# Patient Record
Sex: Female | Born: 1965 | Race: White | Hispanic: No | Marital: Married | Smoking: Never smoker
Health system: Southern US, Community
[De-identification: ages and names within clinical notes are randomized; demographics above are authoritative.]

## PROBLEM LIST (undated history)

## (undated) DIAGNOSIS — M51369 Other intervertebral disc degeneration, lumbar region without mention of lumbar back pain or lower extremity pain: Secondary | ICD-10-CM

## (undated) DIAGNOSIS — I10 Essential (primary) hypertension: Secondary | ICD-10-CM

## (undated) DIAGNOSIS — E785 Hyperlipidemia, unspecified: Secondary | ICD-10-CM

## (undated) DIAGNOSIS — G4730 Sleep apnea, unspecified: Secondary | ICD-10-CM

## (undated) DIAGNOSIS — N200 Calculus of kidney: Secondary | ICD-10-CM

## (undated) DIAGNOSIS — F3160 Bipolar disorder, current episode mixed, unspecified: Secondary | ICD-10-CM

## (undated) DIAGNOSIS — M75122 Complete rotator cuff tear or rupture of left shoulder, not specified as traumatic: Secondary | ICD-10-CM

## (undated) DIAGNOSIS — K219 Gastro-esophageal reflux disease without esophagitis: Secondary | ICD-10-CM

## (undated) DIAGNOSIS — M1990 Unspecified osteoarthritis, unspecified site: Secondary | ICD-10-CM

## (undated) DIAGNOSIS — F32A Depression, unspecified: Secondary | ICD-10-CM

## (undated) DIAGNOSIS — G5601 Carpal tunnel syndrome, right upper limb: Secondary | ICD-10-CM

## (undated) DIAGNOSIS — J40 Bronchitis, not specified as acute or chronic: Secondary | ICD-10-CM

## (undated) DIAGNOSIS — Z87442 Personal history of urinary calculi: Secondary | ICD-10-CM

## (undated) DIAGNOSIS — F419 Anxiety disorder, unspecified: Secondary | ICD-10-CM

## (undated) DIAGNOSIS — T7840XA Allergy, unspecified, initial encounter: Secondary | ICD-10-CM

## (undated) DIAGNOSIS — F329 Major depressive disorder, single episode, unspecified: Secondary | ICD-10-CM

## (undated) DIAGNOSIS — C801 Malignant (primary) neoplasm, unspecified: Secondary | ICD-10-CM

## (undated) DIAGNOSIS — G43909 Migraine, unspecified, not intractable, without status migrainosus: Secondary | ICD-10-CM

## (undated) DIAGNOSIS — F4310 Post-traumatic stress disorder, unspecified: Secondary | ICD-10-CM

## (undated) DIAGNOSIS — K582 Mixed irritable bowel syndrome: Secondary | ICD-10-CM

## (undated) DIAGNOSIS — F909 Attention-deficit hyperactivity disorder, unspecified type: Secondary | ICD-10-CM

## (undated) DIAGNOSIS — Z905 Acquired absence of kidney: Secondary | ICD-10-CM

## (undated) HISTORY — PX: LITHOTRIPSY: SUR834

## (undated) HISTORY — DX: Allergy, unspecified, initial encounter: T78.40XA

## (undated) HISTORY — PX: DIAGNOSTIC LAPAROSCOPY: SUR761

## (undated) HISTORY — DX: Bronchitis, not specified as acute or chronic: J40

## (undated) HISTORY — DX: Bipolar disorder, current episode mixed, unspecified: F31.60

## (undated) HISTORY — PX: OTHER SURGICAL HISTORY: SHX169

## (undated) HISTORY — PX: NEUROPLASTY / TRANSPOSITION ULNAR NERVE AT ELBOW: SUR895

---
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
## 1996-10-05 HISTORY — PX: TUBAL LIGATION: SHX77

## 1997-10-05 DIAGNOSIS — C801 Malignant (primary) neoplasm, unspecified: Secondary | ICD-10-CM

## 1997-10-05 HISTORY — PX: OTHER SURGICAL HISTORY: SHX169

## 1997-10-05 HISTORY — DX: Malignant (primary) neoplasm, unspecified: C80.1

## 1998-10-05 HISTORY — PX: ABDOMINAL HYSTERECTOMY: SHX81

## 1998-10-05 HISTORY — PX: CHOLECYSTECTOMY: SHX55

## 1999-10-06 DIAGNOSIS — F3160 Bipolar disorder, current episode mixed, unspecified: Secondary | ICD-10-CM

## 1999-10-06 HISTORY — DX: Bipolar disorder, current episode mixed, unspecified: F31.60

## 2006-08-13 ENCOUNTER — Ambulatory Visit: Payer: Self-pay | Admitting: Urology

## 2013-09-11 ENCOUNTER — Ambulatory Visit: Payer: Self-pay | Admitting: Urology

## 2013-09-13 ENCOUNTER — Ambulatory Visit: Payer: Self-pay | Admitting: Urology

## 2013-09-15 ENCOUNTER — Ambulatory Visit: Payer: Self-pay | Admitting: Urology

## 2015-01-25 NOTE — Op Note (Signed)
PATIENT NAME:  Shawna Santiago, Shawna Santiago MR#:  [id number] DATE OF BIRTH:  1965/10/31  DATE OF PROCEDURE:  09/15/2013   PREOPERATIVE DIAGNOSIS: Distal left ureteral calculus.   POSTOPERATIVE DIAGNOSIS:  Distal left ureteral calculus.  PROCEDURE: Cystoscopy, left ureteroscopy (rigid), holmium laser lithotripsy, calculus extraction and stent.  ANESTHESIA: General.   PROCEDURE DETAILS: With the patient sterilely prepped and draped in supine lithotomy position for ease of approach to the external genitalia, the procedure was begun. The patient has good relaxation from a general intubated anesthetic. String is out from the stent, so I slide the stent into the urethral meatus and place an 0.035 Glidewire up the stent into the renal pelvis and under fluoroscopy removed the stent. Then there is quite a bit of clot associated with this, so I maneuver the clot out of the ureter with the basket and the ureteroscope. The ureteroscope is a 7-French telescoping ureteroscope. I localized the calculus, which is distal, and with a 365 micron holmium laser fiber with an end firing tip, I destroy the calculus. Fragments are removed with a 4 wire, zero-tipped Nitinol basket. This results in removal of fragments and clot. I am able to see all the way up the ureter with no fragments left of calculi. Because of the clotting and the probable irritation of the ureter from this calculus, I am going to put a stent in. I put a 24 cm 6-French stent into the bladder and up the ureter over the wire. It is in good position in the renal pelvis and I make sure it is pulled into the bladder. I am able to manipulate it with the scope that I drained the bladder with. I put 30 mL of Marcaine into the bladder. The stent moves easily with the sheath, so I know it is in good position outside the ureteral orifice. Coiled in both ends. I put a B and O suppository in the rectum and sent the patient in satisfactory condition to the recovery  room.   ____________________________ Janice Coffin. Elnoria Howard, [place] rdh:ce D: 09/15/2013 13:58:41 ET T: 09/15/2013 14:32:29 ET JOB#: [id number]  cc: Janice Coffin. Elnoria Howard, DO, <Dictator> Seon Gaertner D Oakley Kossman DO ELECTRONICALLY SIGNED 09/22/2013 15:44

## 2015-01-25 NOTE — Op Note (Signed)
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   PROCEDURE: Cysto, left retrograde pyelogram, stent placement.   The patient was sterilely prepped and draped in supine lithotomy position for ease of approach to the external genitalia, the procedure was begun. She has a surgically absent right kidney. Left distal ureter on cystoscopy appeared to be quite edematous with no calculus in the bladder. There were no tumors, masses or growths in the bladder. Attempted passage of a wire past the calculus was not successful until I utilized an open-ended ureteral catheter to give the wire have a space to slide by the calculus. It slid by the calculus easily. Retrograde revealed minimal hydronephrosis, but some present. The calculus was not visualized with plain film. Ureteroscopy probably will be necessary in the future. I am able to then place a 26 cm 6-French double curling, double-J ureteral stent over the 0.038 Glidewire. It moves easily into the kidney, curls in the renal pelvis. On checking the bladder, there is good curl in the bladder. So the bladder was emptied, string from the stent is cut with scissors. Then, 30 mL of 0.5% Marcaine plain are placed in the bladder and then a B and O suppository in the rectum, and she was sent to recovery with empty bladder in satisfactory condition good with good placement by fluoroscopy of the ureteral stent on the left.     ____________________________ Janice Coffin. Elnoria Howard, DO rdh:dmm D: 09/11/2013 13:26:06 ET T: 09/11/2013 13:36:03 ET JOB#: [id number]  cc: Delfino Lovett D. Elnoria Howard, DO, <Dictator> RICHARD D HART DO ELECTRONICALLY SIGNED 09/22/2013 15:39

## 2015-04-24 DIAGNOSIS — G8929 Other chronic pain: Secondary | ICD-10-CM | POA: Insufficient documentation

## 2015-04-24 DIAGNOSIS — R519 Headache, unspecified: Secondary | ICD-10-CM | POA: Insufficient documentation

## 2015-04-24 DIAGNOSIS — R51 Headache: Secondary | ICD-10-CM

## 2015-06-05 ENCOUNTER — Encounter: Payer: Self-pay | Admitting: *Deleted

## 2015-06-05 DIAGNOSIS — Z79899 Other long term (current) drug therapy: Secondary | ICD-10-CM | POA: Insufficient documentation

## 2015-06-05 DIAGNOSIS — R1084 Generalized abdominal pain: Secondary | ICD-10-CM | POA: Insufficient documentation

## 2015-06-05 DIAGNOSIS — R112 Nausea with vomiting, unspecified: Secondary | ICD-10-CM | POA: Diagnosis present

## 2015-06-05 DIAGNOSIS — G43009 Migraine without aura, not intractable, without status migrainosus: Secondary | ICD-10-CM | POA: Diagnosis not present

## 2015-06-05 DIAGNOSIS — N8320 Unspecified ovarian cysts: Secondary | ICD-10-CM | POA: Insufficient documentation

## 2015-06-05 NOTE — ED Notes (Signed)
Pt states she had onset of migraine tonight, hx of the same (gets infusions at [facility] for migraines- 3 days last week, has had botox treatments and nerve blocks), no additional meds for pain today. Describes pain on the top of her head, has had nausea, flashes of light, and some dizziness.

## 2015-06-06 ENCOUNTER — Emergency Department
Admission: EM | Admit: 2015-06-06 | Discharge: 2015-06-06 | Disposition: A | Discharge status: Home / Self Care | Payer: BLUE CROSS/BLUE SHIELD | Attending: Emergency Medicine | Admitting: Emergency Medicine

## 2015-06-06 ENCOUNTER — Emergency Department: Payer: BLUE CROSS/BLUE SHIELD

## 2015-06-06 DIAGNOSIS — G43009 Migraine without aura, not intractable, without status migrainosus: Secondary | ICD-10-CM

## 2015-06-06 DIAGNOSIS — R1084 Generalized abdominal pain: Secondary | ICD-10-CM

## 2015-06-06 DIAGNOSIS — N83201 Unspecified ovarian cyst, right side: Secondary | ICD-10-CM

## 2015-06-06 HISTORY — DX: Malignant (primary) neoplasm, unspecified: C80.1

## 2015-06-06 HISTORY — DX: Migraine, unspecified, not intractable, without status migrainosus: G43.909

## 2015-06-06 HISTORY — DX: Calculus of kidney: N20.0

## 2015-06-06 LAB — BASIC METABOLIC PANEL
Anion gap: 9 (ref 5–15)
BUN: 12 mg/dL (ref 6–20)
CO2: 25 mmol/L (ref 22–32)
CREATININE: 0.68 mg/dL (ref 0.44–1.00)
Calcium: 8.9 mg/dL (ref 8.9–10.3)
Chloride: 105 mmol/L (ref 101–111)
Glucose, Bld: 98 mg/dL (ref 65–99)
Potassium: 3.7 mmol/L (ref 3.5–5.1)
SODIUM: 139 mmol/L (ref 135–145)

## 2015-06-06 LAB — CBC
HCT: 42.8 % (ref 35.0–47.0)
Hemoglobin: 14 g/dL (ref 12.0–16.0)
MCH: 30.7 pg (ref 26.0–34.0)
MCHC: 32.7 g/dL (ref 32.0–36.0)
MCV: 93.9 fL (ref 80.0–100.0)
PLATELETS: 234 10*3/uL (ref 150–440)
RBC: 4.55 MIL/uL (ref 3.80–5.20)
RDW: 13 % (ref 11.5–14.5)
WBC: 11.3 10*3/uL — AB (ref 3.6–11.0)

## 2015-06-06 LAB — TROPONIN I

## 2015-06-06 MED ORDER — ONDANSETRON 4 MG PO TBDP: 4.0000 mg | ORAL_TABLET | Freq: Three times a day (TID) | ORAL | Status: DC | PRN

## 2015-06-06 MED ORDER — MORPHINE SULFATE (PF) 2 MG/ML IV SOLN
2.0000 mg | Freq: Once | INTRAVENOUS | Status: AC
  Administered 2015-06-06: 2 mg via INTRAVENOUS
  Filled 2015-06-06: qty 1

## 2015-06-06 MED ORDER — KETOROLAC TROMETHAMINE 30 MG/ML IJ SOLN
30.0000 mg | Freq: Once | INTRAMUSCULAR | Status: AC
  Administered 2015-06-06: 30 mg via INTRAVENOUS

## 2015-06-06 MED ORDER — DICYCLOMINE HCL 10 MG PO CAPS: 10.0000 mg | ORAL_CAPSULE | Freq: Three times a day (TID) | ORAL | Status: AC

## 2015-06-06 MED ORDER — DICYCLOMINE HCL 10 MG PO CAPS
10.0000 mg | ORAL_CAPSULE | Freq: Once | ORAL | Status: AC
Start: 2015-06-06 — End: 2015-06-06
  Administered 2015-06-06: 10 mg via ORAL
  Filled 2015-06-06: qty 1

## 2015-06-06 MED ORDER — HYDROMORPHONE HCL 1 MG/ML IJ SOLN
1.0000 mg | Freq: Once | INTRAMUSCULAR | Status: AC
  Administered 2015-06-06: 1 mg via INTRAVENOUS
  Filled 2015-06-06: qty 1

## 2015-06-06 MED ORDER — KETOROLAC TROMETHAMINE 30 MG/ML IJ SOLN
INTRAMUSCULAR | Status: AC
  Administered 2015-06-06: 30 mg via INTRAVENOUS
  Filled 2015-06-06: qty 1

## 2015-06-06 MED ORDER — ONDANSETRON HCL 4 MG/2ML IJ SOLN
4.0000 mg | Freq: Once | INTRAMUSCULAR | Status: AC
  Administered 2015-06-06: 4 mg via INTRAVENOUS

## 2015-06-06 MED ORDER — MORPHINE SULFATE (PF) 4 MG/ML IV SOLN
4.0000 mg | Freq: Once | INTRAVENOUS | Status: AC
  Administered 2015-06-06: 4 mg via INTRAVENOUS
  Filled 2015-06-06: qty 1

## 2015-06-06 MED ORDER — IOHEXOL 240 MG/ML SOLN
25.0000 mL | Freq: Once | INTRAMUSCULAR | Status: AC | PRN
  Administered 2015-06-06: 25 mL via ORAL

## 2015-06-06 MED ORDER — SODIUM CHLORIDE 0.9 % IV BOLUS (SEPSIS)
1000.0000 mL | Freq: Once | INTRAVENOUS | Status: AC
  Administered 2015-06-06: 1000 mL via INTRAVENOUS

## 2015-06-06 MED ORDER — ONDANSETRON HCL 4 MG/2ML IJ SOLN
INTRAMUSCULAR | Status: AC
  Administered 2015-06-06: 4 mg via INTRAVENOUS
  Filled 2015-06-06: qty 2

## 2015-06-06 MED ORDER — IOHEXOL 300 MG/ML  SOLN
100.0000 mL | Freq: Once | INTRAMUSCULAR | Status: AC | PRN
  Administered 2015-06-06: 100 mL via INTRAVENOUS

## 2015-06-06 MED ORDER — ONDANSETRON HCL 4 MG/2ML IJ SOLN
4.0000 mg | Freq: Once | INTRAMUSCULAR | Status: AC
  Administered 2015-06-06: 4 mg via INTRAVENOUS
  Filled 2015-06-06: qty 2

## 2015-06-06 NOTE — ED Notes (Signed)
Provided pt with swab sticks per pt request.

## 2015-06-06 NOTE — ED Notes (Signed)
MD Owens Shark made aware of pt crying and with constant pain at this time. New orders acknowledged and placed by MD. Will complete accordingly.

## 2015-06-06 NOTE — ED Notes (Signed)
Patient with no complaints at this time. Respirations even and unlabored. Skin warm/dry. Discharge instructions reviewed with patient at this time. Patient given opportunity to voice concerns/ask questions. IV removed per policy and band-aid applied to site. Patient discharged at this time and left Emergency Department with steady gait, accompanied by spouse/significant other.

## 2015-06-06 NOTE — ED Provider Notes (Signed)
[facility] Emergency Department Provider Note  ____________________________________________  Time seen:   I have reviewed the triage vital signs and the nursing notes.   HISTORY  Chief Complaint Migraine      HPI Shawna Santiago is a 49 y.o. female presents with "migraine headache 3 days" patient states that she's been treated with Botox treatments and nerve blocks for her migraines in the past. In addition patient admits to generalized abdominal pain nausea and vomiting accompanied by nonbloody diarrhea 3 weeks. Patient states that she has not seen a physician in regards to those symptoms.      Past Medical History  Diagnosis Date  . Cancer     kidney cancer  . Migraine   . Kidney stone     There are no active problems to display for this patient.   Past Surgical History  Procedure Laterality Date  . Abdominal hysterectomy    . Cholecystectomy    . Lithotripsy      Current Outpatient Rx  Name  Route  Sig  Dispense  Refill  . ALPRAZolam (XANAX) 0.25 MG tablet   Oral   Take 0.25 mg by mouth 2 (two) times daily as needed for anxiety. Pt takes 1 tablet every morning and 2 tablets every evening.         Marland Kitchen amphetamine-dextroamphetamine (ADDERALL) 30 MG tablet   Oral   Take 30 mg by mouth every evening.         Marland Kitchen buPROPion (WELLBUTRIN XL) 300 MG 24 hr tablet   Oral   Take 300 mg by mouth daily.         . cetirizine (ZYRTEC) 10 MG tablet   Oral   Take 10 mg by mouth daily.         Marland Kitchen lamoTRIgine (LAMICTAL) 100 MG tablet   Oral   Take 200 mg by mouth daily.         Marland Kitchen lisdexamfetamine (VYVANSE) 50 MG capsule   Oral   Take 50 mg by mouth daily.         . Melatonin 5 MG TABS   Oral   Take 1 tablet by mouth daily as needed.         . Multiple Vitamins-Minerals (MULTIVITAMIN GUMMIES ADULTS PO)   Oral   Take 2 tablets by mouth daily.         . rosuvastatin (CRESTOR) 20 MG tablet   Oral   Take 20 mg by mouth  daily.         Marland Kitchen tiZANidine (ZANAFLEX) 2 MG tablet   Oral   Take 2 mg by mouth every 6 (six) hours as needed for muscle spasms.         . Vilazodone HCl (VIIBRYD) 20 MG TABS   Oral   Take 1 tablet by mouth daily.         . Vortioxetine HBr (BRINTELLIX) 20 MG TABS   Oral   Take 20 mg by mouth daily.           Allergies Stadol  No family history on file.  Social History Social History  Substance Use Topics  . Smoking status: Never Smoker   . Smokeless tobacco: None  . Alcohol Use: No    Review of Systems  Constitutional: Negative for fever. Eyes: Negative for visual changes. ENT: Negative for sore throat. Cardiovascular: Negative for chest pain. Respiratory: Negative for shortness of breath. Gastrointestinal: Negative for abdominal pain, vomiting and diarrhea. Genitourinary: Negative for  dysuria. Musculoskeletal: Negative for back pain. Skin: Negative for rash. Neurological: Negative for headaches, focal weakness or numbness.   10-point ROS otherwise negative.  ____________________________________________   PHYSICAL EXAM:  VITAL SIGNS: ED Triage Vitals  Enc Vitals Group     BP 06/05/15 2246 126/91 mmHg     Pulse Rate 06/05/15 2246 91     Resp 06/05/15 2246 18     Temp 06/05/15 2246 97.8 F (36.6 C)     Temp Source 06/05/15 2246 Oral     SpO2 06/05/15 2246 99 %     Weight 06/05/15 2246 147 lb (66.679 kg)     Height 06/05/15 2246 5\' 1"  (1.549 m)     Head Cir --      Peak Flow --      Pain Score 06/05/15 2246 10     Pain Loc --      Pain Edu? --      Excl. in [place]? --      Constitutional: Alert and oriented. Apparent discomfort Eyes: Conjunctivae are normal. PERRL. Normal extraocular movements. ENT   Head: Normocephalic and atraumatic.   Nose: No congestion/rhinnorhea.   Mouth/Throat: Mucous membranes are moist.   Neck: No stridor. Hematological/Lymphatic/Immunilogical: No cervical lymphadenopathy. Cardiovascular: Normal  rate, regular rhythm. Normal and symmetric distal pulses are present in all extremities. No murmurs, rubs, or gallops. Respiratory: Normal respiratory effort without tachypnea nor retractions. Breath sounds are clear and equal bilaterally. No wheezes/rales/rhonchi. Gastrointestinal: Soft and nontender. No distention. There is no CVA tenderness. Genitourinary: deferred Musculoskeletal: Nontender with normal range of motion in all extremities. No joint effusions.  No lower extremity tenderness nor edema. Neurologic:  Normal speech and language. No gross focal neurologic deficits are appreciated. Speech is normal.  Skin:  Skin is warm, dry and intact. No rash noted. Psychiatric: Mood and affect are normal. Speech and behavior are normal. Patient exhibits appropriate insight and judgment.  ____________________________________________    LABS (pertinent positives/negatives)  Labs Reviewed  CBC - Abnormal; Notable for the following:    WBC 11.3 (*)    All other components within normal limits  BASIC METABOLIC PANEL  TROPONIN I       RADIOLOGY       CT Head Wo Contrast (Final result) Result time: 06/06/15 95:32:02   Final result by Rad Results In Interface (06/06/15 33:43:56)   Narrative:   CLINICAL DATA: Initial evaluation for acute migraine.  EXAM: CT HEAD WITHOUT CONTRAST  TECHNIQUE: Contiguous axial images were obtained from the base of the skull through the vertex without intravenous contrast.  COMPARISON: None.  FINDINGS: There is no acute intracranial hemorrhage or infarct. No mass lesion or midline shift. Gray-white matter differentiation is well maintained. Ventricles are normal in size without evidence of hydrocephalus. CSF containing spaces are within normal limits. No extra-axial fluid collection.  The calvarium is intact.  Orbital soft tissues are within normal limits.  The paranasal sinuses and mastoid air cells are well pneumatized and free of  fluid.  Scalp soft tissues are unremarkable.  IMPRESSION: Normal head CT with no acute intracranial process identified.   Electronically Signed By: Jeannine Boga M.D. On: 06/06/2015 05:08          CT Abdomen Pelvis W Contrast (Final result) Result time: 06/06/15 05:25:23   Final result by Rad Results In Interface (06/06/15 05:25:23)   Narrative:   CLINICAL DATA: Initial evaluation for acute generalized abdominal pain with vomiting and diarrhea.  EXAM: CT ABDOMEN AND PELVIS WITH CONTRAST  TECHNIQUE: Multidetector CT imaging of the abdomen and pelvis was performed using the standard protocol following bolus administration of intravenous contrast.  CONTRAST: 164mL OMNIPAQUE IOHEXOL 300 MG/ML SOLN  COMPARISON: Prior CT from 09/11/2013.  FINDINGS: Visualized lung bases are clear.  Liver demonstrates a normal contrast enhanced appearance. Gallbladder is absent. Mild intra and extra hepatic biliary dilatation like related to post cholecystectomy changes. The spleen, adrenal glands, and pancreas demonstrate a normal contrast enhanced appearance.  Right kidney is surgically absent. No soft tissue mass present within the right renal fossa.  Left kidney within normal limits without evidence of nephrolithiasis or hydronephrosis. No focal enhancing renal mass. Subcentimeter hypodensity within the interpolar left kidney too small the characterize by CT, but statistically may reflect a small cyst.  Stomach within normal limits. No evidence for bowel obstruction. No abnormal wall thickening, mucosal enhancement, or inflammatory fat stranding seen about the bowels.  Bladder within normal limits.  Uterus is absent. Left ovary not discretely identified. There is a in ovoid well-circumscribed cystic lesion measuring 2.6 x 4.0 x 3.3 cm within the right ovary. Additional smaller 1.3 cm cystic lesion slightly anteriorly within the right ovary.  No free air.  Trace free fluid within the right pelvic cul-de-sac.  No pathologically enlarged intra-abdominal or pelvic lymph nodes identified. Normal intravascular enhancement seen throughout the intra-abdominal aorta and its branch vessels.  No acute osseous abnormality. No worrisome lytic or blastic osseous lesion.  IMPRESSION: 1. No CT evidence for acute intra-abdominal pelvic process. 2. Status post right nephrectomy, cholecystectomy, and hysterectomy. 3. 4 cm right adnexal cyst with smaller adjacent cyst. Findings are grossly similar relative to previous MRI from 2014. Again, follow-up examination with dedicated pelvic ultrasound is recommended for complete characterization to ensure that these represent simple cysts is recommended if not already performed. 4. Trace free fluid within the right adnexa.   Electronically Signed By: Jeannine Boga M.D. On: 06/06/2015 05:25          INITIAL IMPRESSION / ASSESSMENT AND PLAN / ED COURSE  Pertinent labs & imaging results that were available during my care of the patient were reviewed by me and considered in my medical decision making (see chart for details).  She received IV morphine 2 with resolution of headache with persistence of abdominal discomfort. CT scan of the head revealed no gross abnormalities likewise CT scan of the abdomen. Patient informed of the right adnexal questionable cysts asked if she was informed of it back in 2014 and if she was referred to gynecology to which she responded no.  ____________________________________________   FINAL CLINICAL IMPRESSION(S) / ED DIAGNOSES  Final diagnoses:  Migraine without aura and without status migrainosus, not intractable  Generalized abdominal pain  Cyst of right ovary      Gregor Hams, MD 06/06/15 [phone]

## 2015-06-06 NOTE — Discharge Instructions (Signed)
Migraine Headache A migraine headache is an intense, throbbing pain on one or both sides of your head. A migraine can last for 30 minutes to several hours. CAUSES  The exact cause of a migraine headache is not always known. However, a migraine may be caused when nerves in the brain become irritated and release chemicals that cause inflammation. This causes pain. Certain things may also trigger migraines, such as:  Alcohol.  Smoking.  Stress.  Menstruation.  Aged cheeses.  Foods or drinks that contain nitrates, glutamate, aspartame, or tyramine.  Lack of sleep.  Chocolate.  Caffeine.  Hunger.  Physical exertion.  Fatigue.  Medicines used to treat chest pain (nitroglycerine), birth control pills, estrogen, and some blood pressure medicines. SIGNS AND SYMPTOMS  Pain on one or both sides of your head.  Pulsating or throbbing pain.  Severe pain that prevents daily activities.  Pain that is aggravated by any physical activity.  Nausea, vomiting, or both.  Dizziness.  Pain with exposure to bright lights, loud noises, or activity.  General sensitivity to bright lights, loud noises, or smells. Before you get a migraine, you may get warning signs that a migraine is coming (aura). An aura may include:  Seeing flashing lights.  Seeing bright spots, halos, or zigzag lines.  Having tunnel vision or blurred vision.  Having feelings of numbness or tingling.  Having trouble talking.  Having muscle weakness. DIAGNOSIS  A migraine headache is often diagnosed based on:  Symptoms.  Physical exam.  A CT scan or MRI of your head. These imaging tests cannot diagnose migraines, but they can help rule out other causes of headaches. TREATMENT Medicines may be given for pain and nausea. Medicines can also be given to help prevent recurrent migraines.  HOME CARE INSTRUCTIONS  Only take over-the-counter or prescription medicines for pain or discomfort as directed by your  health care provider. The use of long-term narcotics is not recommended.  Lie down in a dark, quiet room when you have a migraine.  Keep a journal to find out what may trigger your migraine headaches. For example, write down:  What you eat and drink.  How much sleep you get.  Any change to your diet or medicines.  Limit alcohol consumption.  Quit smoking if you smoke.  Get 7-9 hours of sleep, or as recommended by your health care provider.  Limit stress.  Keep lights dim if bright lights bother you and make your migraines worse. SEEK IMMEDIATE MEDICAL CARE IF:   Your migraine becomes severe.  You have a fever.  You have a stiff neck.  You have vision loss.  You have muscular weakness or loss of muscle control.  You start losing your balance or have trouble walking.  You feel faint or pass out.  You have severe symptoms that are different from your first symptoms. MAKE SURE YOU:   Understand these instructions.  Will watch your condition.  Will get help right away if you are not doing well or get worse. Document Released: 09/21/2005 Document Revised: 02/05/2014 Document Reviewed: 05/29/2013 [facility] Patient Information 2015 [place], [place]. This information is not intended to replace advice given to you by your health care provider. Make sure you discuss any questions you have with your health care provider.  Abdominal Pain Many things can cause abdominal pain. Usually, abdominal pain is not caused by a disease and will improve without treatment. It can often be observed and treated at home. Your health care provider will do a physical  exam and possibly order blood tests and X-rays to help determine the seriousness of your pain. However, in many cases, more time must pass before a clear cause of the pain can be found. Before that point, your health care provider may not know if you need more testing or further treatment. HOME CARE INSTRUCTIONS  Monitor your abdominal  pain for any changes. The following actions may help to alleviate any discomfort you are experiencing:  Only take over-the-counter or prescription medicines as directed by your health care provider.  Do not take laxatives unless directed to do so by your health care provider.  Try a clear liquid diet (broth, tea, or water) as directed by your health care provider. Slowly move to a bland diet as tolerated. SEEK MEDICAL CARE IF:  You have unexplained abdominal pain.  You have abdominal pain associated with nausea or diarrhea.  You have pain when you urinate or have a bowel movement.  You experience abdominal pain that wakes you in the night.  You have abdominal pain that is worsened or improved by eating food.  You have abdominal pain that is worsened with eating fatty foods.  You have a fever. SEEK IMMEDIATE MEDICAL CARE IF:   Your pain does not go away within 2 hours.  You keep throwing up (vomiting).  Your pain is felt only in portions of the abdomen, such as the right side or the left lower portion of the abdomen.  You pass bloody or black tarry stools. MAKE SURE YOU:  Understand these instructions.   Will watch your condition.   Will get help right away if you are not doing well or get worse.  Document Released: 07/01/2005 Document Revised: 09/26/2013 Document Reviewed: 05/31/2013 [facility] Patient Information 2015 [place], [place]. This information is not intended to replace advice given to you by your health care provider. Make sure you discuss any questions you have with your health care provider.  Ovarian Cyst An ovarian cyst is a fluid-filled sac that forms on an ovary. The ovaries are small organs that produce eggs in women. Various types of cysts can form on the ovaries. Most are not cancerous. Many do not cause problems, and they often go away on their own. Some may cause symptoms and require treatment. Common types of ovarian cysts include:  Functional  cysts--These cysts may occur every month during the menstrual cycle. This is normal. The cysts usually go away with the next menstrual cycle if the woman does not get pregnant. Usually, there are no symptoms with a functional cyst.  Endometrioma cysts--These cysts form from the tissue that lines the uterus. They are also called "chocolate cysts" because they become filled with blood that turns Shawna Santiago. This type of cyst can cause pain in the lower abdomen during intercourse and with your menstrual period.  Cystadenoma cysts--This type develops from the cells on the outside of the ovary. These cysts can get very big and cause lower abdomen pain and pain with intercourse. This type of cyst can twist on itself, cut off its blood supply, and cause severe pain. It can also easily rupture and cause a lot of pain.  Dermoid cysts--This type of cyst is sometimes found in both ovaries. These cysts may contain different kinds of body tissue, such as skin, teeth, hair, or cartilage. They usually do not cause symptoms unless they get very big.  Theca lutein cysts--These cysts occur when too much of a certain hormone (human chorionic gonadotropin) is produced and overstimulates the ovaries  to produce an egg. This is most common after procedures used to assist with the conception of a baby (in vitro fertilization). CAUSES   Fertility drugs can cause a condition in which multiple large cysts are formed on the ovaries. This is called ovarian hyperstimulation syndrome.  A condition called polycystic ovary syndrome can cause hormonal imbalances that can lead to nonfunctional ovarian cysts. SIGNS AND SYMPTOMS  Many ovarian cysts do not cause symptoms. If symptoms are present, they may include:  Pelvic pain or pressure.  Pain in the lower abdomen.  Pain during sexual intercourse.  Increasing girth (swelling) of the abdomen.  Abnormal menstrual periods.  Increasing pain with menstrual periods.  Stopping having  menstrual periods without being pregnant. DIAGNOSIS  These cysts are commonly found during a routine or annual pelvic exam. Tests may be ordered to find out more about the cyst. These tests may include:  Ultrasound.  X-ray of the pelvis.  CT scan.  MRI.  Blood tests. TREATMENT  Many ovarian cysts go away on their own without treatment. Your health care provider may want to check your cyst regularly for 2-3 months to see if it changes. For women in menopause, it is particularly important to monitor a cyst closely because of the higher rate of ovarian cancer in menopausal women. When treatment is needed, it may include any of the following:  A procedure to drain the cyst (aspiration). This may be done using a long needle and ultrasound. It can also be done through a laparoscopic procedure. This involves using a thin, lighted tube with a tiny camera on the end (laparoscope) inserted through a small incision.  Surgery to remove the whole cyst. This may be done using laparoscopic surgery or an open surgery involving a larger incision in the lower abdomen.  Hormone treatment or birth control pills. These methods are sometimes used to help dissolve a cyst. HOME CARE INSTRUCTIONS   Only take over-the-counter or prescription medicines as directed by your health care provider.  Follow up with your health care provider as directed.  Get regular pelvic exams and Pap tests. SEEK MEDICAL CARE IF:   Your periods are late, irregular, or painful, or they stop.  Your pelvic pain or abdominal pain does not go away.  Your abdomen becomes larger or swollen.  You have pressure on your bladder or trouble emptying your bladder completely.  You have pain during sexual intercourse.  You have feelings of fullness, pressure, or discomfort in your stomach.  You lose weight for no apparent reason.  You feel generally ill.  You become constipated.  You lose your appetite.  You develop acne.  You  have an increase in body and facial hair.  You are gaining weight, without changing your exercise and eating habits.  You think you are pregnant. SEEK IMMEDIATE MEDICAL CARE IF:   You have increasing abdominal pain.  You feel sick to your stomach (nauseous), and you throw up (vomit).  You develop a fever that comes on suddenly.  You have abdominal pain during a bowel movement.  Your menstrual periods become heavier than usual. MAKE SURE YOU:  Understand these instructions.  Will watch your condition.  Will get help right away if you are not doing well or get worse. Document Released: 09/21/2005 Document Revised: 09/26/2013 Document Reviewed: 05/29/2013 [facility] Patient Information 2015 [place], [place]. This information is not intended to replace advice given to you by your health care provider. Make sure you discuss any questions you have with  your health care provider.

## 2015-07-05 ENCOUNTER — Encounter
Admission: RE | Disposition: A | Discharge status: Home / Self Care | Payer: Self-pay | Source: Ambulatory Visit | Attending: Unknown Physician Specialty

## 2015-07-05 ENCOUNTER — Ambulatory Visit: Payer: BLUE CROSS/BLUE SHIELD | Admitting: Anesthesiology

## 2015-07-05 ENCOUNTER — Encounter: Payer: Self-pay | Admitting: *Deleted

## 2015-07-05 ENCOUNTER — Ambulatory Visit
Admission: RE | Admit: 2015-07-05 | Discharge: 2015-07-05 | Disposition: A | Discharge status: Home / Self Care | Payer: BLUE CROSS/BLUE SHIELD | Source: Ambulatory Visit | Attending: Unknown Physician Specialty | Admitting: Unknown Physician Specialty

## 2015-07-05 DIAGNOSIS — K640 First degree hemorrhoids: Secondary | ICD-10-CM | POA: Insufficient documentation

## 2015-07-05 DIAGNOSIS — E785 Hyperlipidemia, unspecified: Secondary | ICD-10-CM | POA: Insufficient documentation

## 2015-07-05 DIAGNOSIS — K210 Gastro-esophageal reflux disease with esophagitis: Secondary | ICD-10-CM | POA: Insufficient documentation

## 2015-07-05 DIAGNOSIS — R112 Nausea with vomiting, unspecified: Secondary | ICD-10-CM | POA: Diagnosis not present

## 2015-07-05 DIAGNOSIS — Z9049 Acquired absence of other specified parts of digestive tract: Secondary | ICD-10-CM | POA: Insufficient documentation

## 2015-07-05 DIAGNOSIS — R1032 Left lower quadrant pain: Secondary | ICD-10-CM | POA: Diagnosis present

## 2015-07-05 DIAGNOSIS — R197 Diarrhea, unspecified: Secondary | ICD-10-CM | POA: Diagnosis present

## 2015-07-05 DIAGNOSIS — Z905 Acquired absence of kidney: Secondary | ICD-10-CM | POA: Insufficient documentation

## 2015-07-05 DIAGNOSIS — Z888 Allergy status to other drugs, medicaments and biological substances status: Secondary | ICD-10-CM | POA: Insufficient documentation

## 2015-07-05 DIAGNOSIS — Z79899 Other long term (current) drug therapy: Secondary | ICD-10-CM | POA: Insufficient documentation

## 2015-07-05 DIAGNOSIS — R1013 Epigastric pain: Secondary | ICD-10-CM | POA: Insufficient documentation

## 2015-07-05 DIAGNOSIS — Z87442 Personal history of urinary calculi: Secondary | ICD-10-CM | POA: Diagnosis not present

## 2015-07-05 DIAGNOSIS — R1084 Generalized abdominal pain: Secondary | ICD-10-CM | POA: Insufficient documentation

## 2015-07-05 DIAGNOSIS — K2950 Unspecified chronic gastritis without bleeding: Secondary | ICD-10-CM | POA: Diagnosis not present

## 2015-07-05 DIAGNOSIS — Z85528 Personal history of other malignant neoplasm of kidney: Secondary | ICD-10-CM | POA: Diagnosis not present

## 2015-07-05 DIAGNOSIS — Z90710 Acquired absence of both cervix and uterus: Secondary | ICD-10-CM | POA: Insufficient documentation

## 2015-07-05 DIAGNOSIS — R1031 Right lower quadrant pain: Secondary | ICD-10-CM | POA: Insufficient documentation

## 2015-07-05 HISTORY — PX: COLONOSCOPY WITH PROPOFOL: SHX5780

## 2015-07-05 HISTORY — PX: ESOPHAGOGASTRODUODENOSCOPY: SHX5428

## 2015-07-05 SURGERY — COLONOSCOPY WITH PROPOFOL
Anesthesia: General

## 2015-07-05 MED ORDER — PROPOFOL 500 MG/50ML IV EMUL
INTRAVENOUS | Status: DC | PRN
  Administered 2015-07-05: 160 ug/kg/min via INTRAVENOUS

## 2015-07-05 MED ORDER — LIDOCAINE HCL (CARDIAC) 20 MG/ML IV SOLN
INTRAVENOUS | Status: DC | PRN
  Administered 2015-07-05: 80 mg via INTRAVENOUS

## 2015-07-05 MED ORDER — MIDAZOLAM HCL 2 MG/2ML IJ SOLN
INTRAMUSCULAR | Status: DC | PRN
  Administered 2015-07-05: 2 mg via INTRAVENOUS

## 2015-07-05 MED ORDER — SODIUM CHLORIDE 0.9 % IV SOLN
INTRAVENOUS | Status: DC
  Administered 2015-07-05: 13:00:00 via INTRAVENOUS

## 2015-07-05 NOTE — H&P (Signed)
Primary Care Physician:  Idelle Crouch, MD Primary Gastroenterologist:  Dr. Vira Agar  Pre-Procedure History & Physical: HPI:  Shawna Santiago is a 49 y.o. female is here for an endoscopy and colonoscopy.   Past Medical History  Diagnosis Date  . Cancer     kidney cancer  . Migraine   . Kidney stone     Past Surgical History  Procedure Laterality Date  . Abdominal hysterectomy    . Cholecystectomy    . Lithotripsy      Prior to Admission medications   Medication Sig Start Date End Date Taking? Authorizing Provider  ALPRAZolam (XANAX) 0.25 MG tablet Take 0.25 mg by mouth 2 (two) times daily as needed for anxiety. Pt takes 1 tablet every morning and 2 tablets every evening.   Yes Historical Provider, MD  amphetamine-dextroamphetamine (ADDERALL) 30 MG tablet Take 30 mg by mouth every evening.   Yes Historical Provider, MD  buPROPion (WELLBUTRIN XL) 300 MG 24 hr tablet Take 300 mg by mouth daily.   Yes Historical Provider, MD  cetirizine (ZYRTEC) 10 MG tablet Take 10 mg by mouth daily.   Yes Historical Provider, MD  lamoTRIgine (LAMICTAL) 100 MG tablet Take 200 mg by mouth daily.   Yes Historical Provider, MD  lisdexamfetamine (VYVANSE) 50 MG capsule Take 50 mg by mouth daily.   Yes Historical Provider, MD  Melatonin 5 MG TABS Take 1 tablet by mouth daily as needed.   Yes Historical Provider, MD  ondansetron (ZOFRAN ODT) 4 MG disintegrating tablet Take 1 tablet (4 mg total) by mouth every 8 (eight) hours as needed for nausea or vomiting. 06/06/15  Yes Gregor Hams, MD  rosuvastatin (CRESTOR) 20 MG tablet Take 20 mg by mouth daily.   Yes Historical Provider, MD  tiZANidine (ZANAFLEX) 2 MG tablet Take 2 mg by mouth every 6 (six) hours as needed for muscle spasms.   Yes Historical Provider, MD  Vilazodone HCl (VIIBRYD) 20 MG TABS Take 1 tablet by mouth daily.   Yes Historical Provider, MD  Vortioxetine HBr (BRINTELLIX) 20 MG TABS Take 20 mg by mouth daily.   Yes Historical Provider,  MD  dicyclomine (BENTYL) 10 MG capsule Take 1 capsule (10 mg total) by mouth 4 (four) times daily -  before meals and at bedtime. 06/06/15 06/12/15  Gregor Hams, MD  Multiple Vitamins-Minerals (MULTIVITAMIN GUMMIES ADULTS PO) Take 2 tablets by mouth daily.    Historical Provider, MD    Allergies as of 06/17/2015 - Review Complete 06/06/2015  Allergen Reaction Noted  . Stadol [butorphanol] Hives 06/05/2015    History reviewed. No pertinent family history.  Social History   Social History  . Marital Status: Married    Spouse Name: N/A  . Number of Children: N/A  . Years of Education: N/A   Occupational History  . Not on file.   Social History Main Topics  . Smoking status: Never Smoker   . Smokeless tobacco: Not on file  . Alcohol Use: No  . Drug Use: No  . Sexual Activity: Not on file   Other Topics Concern  . Not on file   Social History Narrative    Review of Systems: See HPI, otherwise negative ROS  Physical Exam: BP 131/75 mmHg  Pulse 94  Temp(Src) 98 F (36.7 C) (Tympanic)  Resp 12  Ht 5\' 1"  (1.549 m)  Wt 65.772 kg (145 lb)  BMI 27.41 kg/m2  SpO2 100% General:   Alert,  pleasant and cooperative in  NAD Head:  Normocephalic and atraumatic. Neck:  Supple; no masses or thyromegaly. Lungs:  Clear throughout to auscultation.    Heart:  Regular rate and rhythm. Abdomen:  Flat, some tenderness, and nondistended. Normal bowel sounds. Neurologic:  Alert and  oriented x4;  grossly normal neurologically.  Impression/Plan: Shawna Santiago is here for an endoscopy and colonoscopy to be performed for abdominal pain and vomiting and diarrhea  Risks, benefits, limitations, and alternatives regarding  endoscopy and colonoscopy have been reviewed with the patient.  Questions have been answered.  All parties agreeable.   Gaylyn Cheers, MD  07/05/2015, 1:23 PM

## 2015-07-05 NOTE — Op Note (Signed)
[facility] Gastroenterology Patient Name: Shawna Santiago Procedure Date: 07/05/2015 1:14 PM MRN: [id number] Account #: [id number] Date of Birth: 02-04-1966 Admit Type: Outpatient Age: 49 Room: [facility] ENDO ROOM 1 Gender: Female Note Status: Finalized Procedure:         Colonoscopy Indications:       Generalized abdominal pain, Diarrhea Providers:         Manya Silvas, MD Referring MD:      Leonie Douglas. Doy Hutching, MD (Referring MD) Medicines:         Propofol per Anesthesia Complications:     No immediate complications. Procedure:         Pre-Anesthesia Assessment:                    - After reviewing the risks and benefits, the patient was                     deemed in satisfactory condition to undergo the procedure.                    After obtaining informed consent, the colonoscope was                     passed under direct vision. Throughout the procedure, the                     patient's blood pressure, pulse, and oxygen saturations                     were monitored continuously. The Colonoscope was                     introduced through the anus and advanced to the the cecum,                     identified by appendiceal orifice and ileocecal valve. The                     colonoscopy was performed without difficulty. The patient                     tolerated the procedure well. The quality of the bowel                     preparation was good. Findings:      The sigmoid was mildly tortuous and fixed. Possibly due to intra       abdominal adhesions.      A patchy area of spotted mildly erythematous mucosa was found in the       recto-sigmoid colon. This was biopsied with a cold forceps for histology.      Internal hemorrhoids were found during endoscopy. The hemorrhoids were       small, medium-sized and Grade I (internal hemorrhoids that do not       prolapse).      The exam was otherwise without abnormality. Due to diarrhea I did       biopsies of  ascending, transverse, descending and sigmoid colon. These       areas looked grossly OK. Impression:        - Erythematous mucosa in the recto-sigmoid colon. Biopsied.                    - Internal hemorrhoids.                    -  The examination was otherwise normal. Recommendation:    - Await pathology results. Manya Silvas, MD 07/05/2015 2:16:15 PM This report has been signed electronically. Number of Addenda: 0 Note Initiated On: 07/05/2015 1:14 PM Scope Withdrawal Time: 0 hours 10 minutes 33 seconds  Total Procedure Duration: 0 hours 23 minutes 43 seconds       [facility]

## 2015-07-05 NOTE — Transfer of Care (Signed)
Immediate Anesthesia Transfer of Care Note  Patient: Shawna Santiago  Procedure(s) Performed: Procedure(s): COLONOSCOPY WITH PROPOFOL (N/A) ESOPHAGOGASTRODUODENOSCOPY (EGD)  Patient Location: PACU and Endoscopy Unit  Anesthesia Type:General  Level of Consciousness: sedated  Airway & Oxygen Therapy: Patient Spontanous Breathing and Patient connected to nasal cannula oxygen  Post-op Assessment: Report given to RN and Post -op Vital signs reviewed and stable  Post vital signs: Reviewed and stable  Last Vitals: 100%-79hr-15resp-125/76 96.6temp Filed Vitals:   07/05/15 1236  BP: 131/75  Pulse: 94  Temp: 36.7 C  Resp: 12    Complications: No apparent anesthesia complications

## 2015-07-05 NOTE — Op Note (Signed)
[facility] Gastroenterology Patient Name: Shawna Santiago Procedure Date: 07/05/2015 1:23 PM MRN: [id number] Account #: [id number] Date of Birth: 30-Jun-1966 Admit Type: Outpatient Age: 49 Room: [facility] ENDO ROOM 1 Gender: Female Note Status: Finalized Procedure:         Upper GI endoscopy Indications:       Epigastric abdominal pain, Generalized abdominal pain,                     Nausea with vomiting Providers:         Manya Silvas, MD Referring MD:      Leonie Douglas. Doy Hutching, MD (Referring MD) Medicines:         Propofol per Anesthesia Complications:     No immediate complications. Procedure:         Pre-Anesthesia Assessment:                    - After reviewing the risks and benefits, the patient was                     deemed in satisfactory condition to undergo the procedure.                    After obtaining informed consent, the endoscope was passed                     under direct vision. Throughout the procedure, the                     patient's blood pressure, pulse, and oxygen saturations                     were monitored continuously. The Endoscope was introduced                     through the mouth, and advanced to the second part of                     duodenum. The upper GI endoscopy was accomplished without                     difficulty. The patient tolerated the procedure well. Findings:      There were esophageal mucosal changes suspicious for short-segment       Barrett's esophagus present in the lower third of the esophagus. The       maximum longitudinal extent of these mucosal changes was 1 cm in length.       Mucosa was biopsied with a cold forceps for histology. One specimen       bottle was sent to pathology. GEJ 35cm Barrets 1-2cm.      Diffuse and patchy mild inflammation characterized by erythema and       granularity was found in the gastric body and in the gastric antrum.       Biopsies were taken with a cold forceps for histology.  Biopsies were       taken with a cold forceps for Helicobacter pylori testing.      There were dark flakes of blood in the proximal antrum. Likely from       vomiting      Localized moderate inflammation characterized by erythema and       granularity was found on the anterior wall lesser curvature of the       stomach. Biopsies  were taken with a cold forceps for histology. Likely       from vomiting.      The examined duodenum was normal. Impression:        - Esophageal mucosal changes suspicious for short-segment                     Barrett's esophagus. Biopsied.                    - Gastritis. Biopsied.                    - Gastritis. Biopsied.                    - Normal examined duodenum. Recommendation:    - Await pathology results.                    - Perform a colonoscopy as previously scheduled. Manya Silvas, MD 07/05/2015 1:45:53 PM This report has been signed electronically. Number of Addenda: 0 Note Initiated On: 07/05/2015 1:23 PM      [facility]

## 2015-07-05 NOTE — Anesthesia Preprocedure Evaluation (Signed)
Anesthesia Evaluation  Patient identified by MRN, date of birth, ID band Patient awake    Reviewed: Allergy & Precautions, H&P , NPO status , Patient's Chart, lab work & pertinent test results  History of Anesthesia Complications Negative for: history of anesthetic complications  Airway Mallampati: II  TM Distance: >3 FB Neck ROM: full    Dental no notable dental hx. (+) Teeth Intact   Pulmonary neg pulmonary ROS,    Pulmonary exam normal breath sounds clear to auscultation       Cardiovascular Exercise Tolerance: Good (-) Past MI negative cardio ROS Normal cardiovascular exam Rhythm:regular Rate:Normal     Neuro/Psych  Headaches, negative psych ROS   GI/Hepatic negative GI ROS, Neg liver ROS, neg GERD  ,  Endo/Other  negative endocrine ROS  Renal/GU Renal disease  negative genitourinary   Musculoskeletal   Abdominal   Peds  Hematology negative hematology ROS (+)   Anesthesia Other Findings Past Medical History:   Cancer                                                         Comment:kidney cancer   Migraine                                                     Kidney stone                                                 Reproductive/Obstetrics negative OB ROS                             Anesthesia Physical Anesthesia Plan  ASA: III  Anesthesia Plan: General   Post-op Pain Management:    Induction:   Airway Management Planned:   Additional Equipment:   Intra-op Plan:   Post-operative Plan:   Informed Consent: I have reviewed the patients History and Physical, chart, labs and discussed the procedure including the risks, benefits and alternatives for the proposed anesthesia with the patient or authorized representative who has indicated his/her understanding and acceptance.   Dental Advisory Given  Plan Discussed with: Anesthesiologist, CRNA and Surgeon  Anesthesia Plan  Comments:         Anesthesia Quick Evaluation

## 2015-07-09 LAB — SURGICAL PATHOLOGY

## 2015-07-11 NOTE — Anesthesia Postprocedure Evaluation (Signed)
  Anesthesia Post-op Note  Patient: Shawna Santiago  Procedure(s) Performed: Procedure(s): COLONOSCOPY WITH PROPOFOL (N/A) ESOPHAGOGASTRODUODENOSCOPY (EGD)  Anesthesia type:General  Patient location: PACU  Post pain: Pain level controlled  Post assessment: Post-op Vital signs reviewed, Patient's Cardiovascular Status Stable, Respiratory Function Stable, Patent Airway and No signs of Nausea or vomiting  Post vital signs: Reviewed and stable  Last Vitals:  Filed Vitals:   07/05/15 1444  BP: 128/79  Pulse: 71  Temp:   Resp: 18    Level of consciousness: awake, alert  and patient cooperative  Complications: No apparent anesthesia complications

## 2015-07-26 ENCOUNTER — Encounter: Payer: Self-pay | Admitting: Unknown Physician Specialty

## 2015-08-15 ENCOUNTER — Encounter: Payer: Self-pay | Admitting: Physical Therapy

## 2015-08-15 ENCOUNTER — Ambulatory Visit: Payer: BLUE CROSS/BLUE SHIELD | Attending: Obstetrics and Gynecology | Admitting: Physical Therapy

## 2015-08-15 DIAGNOSIS — R198 Other specified symptoms and signs involving the digestive system and abdomen: Secondary | ICD-10-CM | POA: Insufficient documentation

## 2015-08-15 DIAGNOSIS — R279 Unspecified lack of coordination: Secondary | ICD-10-CM

## 2015-08-15 DIAGNOSIS — M6281 Muscle weakness (generalized): Secondary | ICD-10-CM | POA: Insufficient documentation

## 2015-08-15 DIAGNOSIS — R29898 Other symptoms and signs involving the musculoskeletal system: Secondary | ICD-10-CM

## 2015-08-16 ENCOUNTER — Ambulatory Visit: Payer: BLUE CROSS/BLUE SHIELD | Admitting: Physical Therapy

## 2015-08-16 NOTE — Therapy (Deleted)
[place] [facility] SERVICES [address] [place], [place], [address] Phone: [phone]   Fax:  [phone]  Patient Details  Name: Shawna Santiago MRN: [id number] Date of Birth: 1965/10/07 Referring Provider:  Benjaman Kindler, MD  Encounter Date: 08/15/2015   Jerl Mina ,PT, DPT, E-RYT  08/16/2015, 4:38 PM  [place] [facility] SERVICES [address] [place], [place], [address] Phone: [phone]   Fax:  [phone]

## 2015-08-21 ENCOUNTER — Ambulatory Visit: Payer: BLUE CROSS/BLUE SHIELD | Admitting: Physical Therapy

## 2015-08-21 DIAGNOSIS — R279 Unspecified lack of coordination: Secondary | ICD-10-CM

## 2015-08-21 DIAGNOSIS — R198 Other specified symptoms and signs involving the digestive system and abdomen: Secondary | ICD-10-CM

## 2015-08-21 DIAGNOSIS — R29898 Other symptoms and signs involving the musculoskeletal system: Secondary | ICD-10-CM

## 2015-08-22 NOTE — Therapy (Signed)
[place] MAIN [facility] SERVICES [address] [place], [place], [address] Phone: [phone]   Fax:  [phone]  Physical Therapy Evaluation  Patient Details  Name: Shawna Santiago MRN: [id number] Date of Birth: 07/31/1966 Referring Provider: Leafy Ro   Encounter Date: 08/15/2015      PT End of Session - 08/22/15 1121    Visit Number 1   Number of Visits 12   Date for PT Re-Evaluation 10/24/15   PT Start Time 0945   PT Stop Time 1100   PT Time Calculation (min) 75 min   Activity Tolerance Patient tolerated treatment well;No increased pain   Behavior During Therapy [facility] for tasks assessed/performed      Past Medical History  Diagnosis Date  . Cancer [facility])     kidney cancer  . Migraine   . Kidney stone   . Allergy   . Bronchitis   . Bipolar 1 disorder, mixed ([place]) 2001    11/10: Pt reports she is working with neuropsychiatrist/ psychotherapist for meds managment and regular therapy.     Past Surgical History  Procedure Laterality Date  . Lithotripsy    . Colonoscopy with propofol N/A 07/05/2015    Procedure: COLONOSCOPY WITH PROPOFOL;  Surgeon: Manya Silvas, MD;  Location: [facility] ENDOSCOPY;  Service: Endoscopy;  Laterality: N/A;  . Esophagogastroduodenoscopy  07/05/2015    Procedure: ESOPHAGOGASTRODUODENOSCOPY (EGD);  Surgeon: Manya Silvas, MD;  Location: [facility] ENDOSCOPY;  Service: Endoscopy;;  . Abdominal hysterectomy  2000    R ovary remains (Laproscopic procedure)   . Tubal ligation  1998  . Cholecystectomy  2000  . Removal of r kidney   1999    due to cancer    There were no vitals filed for this visit.  Visit Diagnosis:  Lack of coordination - Plan: PT plan of care cert/re-cert  Abdominal weakness - Plan: PT plan of care cert/re-cert  Weakness of back - Plan: PT plan of care cert/re-cert      Subjective Assessment - 08/22/15 1111    Subjective Pt contributes to her abdominopelvic pain and hip pain  to   depression ( Hx of bipolar). Pain has interfered with her ability to have sexual  intercourse with husband for the past 3 months. Pt report dyspareunia (10/10) occurred with half penetration with symptoms over the abdominopelvic area mainly on the R and pain lingered for 1 day. Abdominopelvic pain also occurs with diarrhea after taking Bentyl (prescribed by GI for breaking down food). Bowel movements occurs every 3 days; a pattern since she was a teenager with [place] Stool Type 5 -7. Pt also reports SUI and has to wear a pantyliner every day. Pt reports R hip pain which started 1 year ago insiduously and interferes with sitting, standing during which it radiates down the lateral thigh to the midshin constantly on a average 8-10/10 pain. With 30 min- 2 hrs of walking, the pain reaches 10/10.  No easing factors for radiating pain. Pt states the radiating pain interrupts her sleep (preferred sleeping position on L side without a pillow between knees). Pt  states knee pain w/ stairclimbing.        Pertinent History Pt reports her abdominopelvic pain started last month which she was seen at the ED and was informed by her PCP (Dr. Vanessa Kick that her pain was related to her R ovarian cyst. Referring provider Dr. Leafy Ro informed pt that the vaginal [place] she ordered visualized 3 cysts on R ovary. Pt was  put on metronidazole after her appt for bacterial vaginosis which she has completed this week. Pt was informed she would be put on birth control for management of the cysts but pt prefers for surgurical removal of the cysts and R ovary. Pt will see Dr. Leafy Ro tomorrow discuss further. Pt stated she had nausea and vomiting in August -Sept which has been managed with care of her GI MD (Dr. Tiffany Kocher). Pt reports these Sx have improved  with meds but she did have an episode of vomiting and diarrhea last week for 18 hours.    Patient Stated Goals have sexual intercourse at toelrable pain 5/10, walk with 100# dog, decrease pain  to decrease depression    Pain Onset More than a month ago            [facility] PT Assessment - 08/22/15 1112    Assessment   Medical Diagnosis pelvic pain    Precautions   Precautions None   Restrictions   Weight Bearing Restrictions No   Home Environment   Additional Comments 13 stairs in home    Prior Function   Level of Independence Independent   Observation/Other Assessments   Observations seated w/ WBing on R hip  slouched   Other Surveys  --  PSFS: sex 0/10, stairclimbing, 3/10, walking > 30 min 3/10, PSQI 715, DASS 44%    Coordination   Gross Motor Movements are Fluid and Coordinated --  chest breathing   Fine Motor Movements are Fluid and Coordinated --  pelvic floor straining w/ cue for bowel momvent   Posture/Postural Control   Posture Comments lumbopelvic instability with ASLR   AROM   Overall AROM  --  spinal flex ~45 deg,sidebend R  rotation L= + pain   Overall AROM Comments --   Lumbar - Left Side Bend repeated movements 10 reps, centralization to thigh, 2nd set of 10, up to hip    Palpation   Palpation comment scar restriction upper/lower R and flank   Bed Mobility   Bed Mobility --  crunch method, able to log roll                 Pelvic Floor Special Questions - 08/22/15 1119    Diastasis Recti neg          [facility] Adult PT Treatment/Exercise - 08/22/15 1112    Self-Care   Self-Care --  anatomy/phsyiology, POC, goals education   Neuro Re-ed    Neuro Re-ed Details  decreasing stain on deep core mm    Lumbar Exercises: Standing   Other Standing Lumbar Exercises repeated movements L side bend 3 sets  centralized pain                PT Education - 08/22/15 1121    Education provided Yes   Education Details HEP, POC, goals, education    Person(s) Educated Patient   Methods Explanation;Demonstration;Tactile cues;Verbal cues;Handout   Comprehension Returned demonstration;Verbalized understanding             PT Long Term  Goals - 08/22/15 1122    PT LONG TERM GOAL #1   Title Pt will increase her score on PSFS from sex 0/10, stairclimbing, 3/10, walking > 30 min 3/10 to > 5/10 for all activities to improve QOL.    Time 12   Period Weeks   Status New   PT LONG TERM GOAL #2   Title Pt will demo decreased score on DASS from 44% to < 40%  to  show improved self-care and better management with relaxation HEP.    Time 12   Period Weeks   Status New   PT LONG TERM GOAL #3   Title Pt will show decreased score on PSQI from 71% to < 65% in order to show improved sleep.    Time 12   Period Weeks   Status New   PT LONG TERM GOAL #4   Title Pt will report bowel movements occuring from every 3 days to daily in  order to show improved bowel m function.    Time 12   Period Weeks   Status New   PT LONG TERM GOAL #5   Title Pt will report no longer needing to wear a panty liner for 3-5 days  as she is able to control her pelvic floor muscles to prevent urinary leakage.    Time 12   Period Weeks   Status New               Plan - 08/22/15 1130    Clinical Impression Statement Pt is a 49 yo female with c/o   abdominopelvic,  hip pain, knee pain, dyspareunia, constipation, and SUI. Pt's PT  exam revealed hip pain as a cause from lumbar radiculopathy. Radicular Sx  centralized with McKenzie Method (repeated movements) after today's  session . Pt also showed increased scar immobility along R flank and  abdomen, weakness and poor coordination in deep core muscles, and weak  back muscles.  Pt's abdominopelvic pain will be follow up with gynecologist  due  to presence of ovarian cysts.  Deep soft-tissue mobilization will be withheld until   communication with MD for clearance. These deficits impact pt's QOL by  interfering her quality of sleep, relationship with her husband,  walking  her dog, and stair climbing.      Pt will benefit from skilled therapeutic intervention in order to improve on the following  deficits Abnormal gait;Hypermobility;Impaired flexibility;Postural dysfunction;Improper body mechanics;Decreased cognition;Decreased coordination;Decreased safety awareness;Decreased mobility;Decreased balance;Difficulty walking;Increased muscle spasms;Decreased strength;Increased fascial restricitons;Pain;Decreased scar mobility;Decreased endurance   Rehab Potential Good   PT Frequency 2x / week   PT Duration 12 weeks   PT Treatment/Interventions ADLs/Self Care Home Management;Aquatic Therapy;Electrical Stimulation;Cryotherapy;Biofeedback;Neuromuscular re-education;Patient/family education;Gait training;Stair training;Functional mobility training;Therapeutic exercise;Balance training;Moist Heat;Traction;Manual techniques;Taping;Energy conservation;Dry needling;Passive range of motion;Scar mobilization   Consulted and Agree with Plan of Care Patient         Problem List There are no active problems to display for this patient.   Jerl Mina  ,PT, DPT, E-RYT  08/22/2015, 11:45 AM  [place] MAIN [facility] SERVICES [address] [place], [place], [address] Phone: [phone]   Fax:  [phone]  Name: Shawna Santiago MRN: [id number] Date of Birth: 1966/05/29

## 2015-08-22 NOTE — Addendum Note (Signed)
Addended by: Jerl Mina on: 08/22/2015 11:50 AM   Modules accepted: Orders

## 2015-08-22 NOTE — Addendum Note (Signed)
Addended by: Jerl Mina on: 08/22/2015 11:47 AM   Modules accepted: Orders

## 2015-08-22 NOTE — Patient Instructions (Signed)
     Repeated movements  L Sidebend every 2 hrs for 48 hrs, decrease lean ing on R side when sitting

## 2015-08-23 ENCOUNTER — Ambulatory Visit: Payer: BLUE CROSS/BLUE SHIELD | Admitting: Physical Therapy

## 2015-08-23 DIAGNOSIS — R279 Unspecified lack of coordination: Secondary | ICD-10-CM | POA: Diagnosis not present

## 2015-08-23 DIAGNOSIS — R29898 Other symptoms and signs involving the musculoskeletal system: Secondary | ICD-10-CM

## 2015-08-23 DIAGNOSIS — R198 Other specified symptoms and signs involving the digestive system and abdomen: Secondary | ICD-10-CM

## 2015-08-23 NOTE — Patient Instructions (Signed)
Bed routine: in the morning  Stretches:     Knees side to side together  Hug one knee on exhale  (5 breaths)   Cross ankle over and pull with opposite arm (5 breaths)    Strengthening  (30 reps ) x 2x day  Pelvic tilts with Level 1 (Breathing)   Level 2    Get on belly, heel prone 5 sec hold on exhale   (20 reps)

## 2015-08-23 NOTE — Therapy (Signed)
[place] MAIN [facility] SERVICES [address] [place], [place], [address] Phone: [phone]   Fax:  [phone]  Physical Therapy Treatment  Patient Details  Name: Shawna Santiago MRN: [id number] Date of Birth: Jan 12, 1966 Referring Provider: Leafy Ro   Encounter Date: 08/23/2015      PT End of Session - 08/23/15 1212    Visit Number 3   Number of Visits 12   Date for PT Re-Evaluation 10/24/15   PT Start Time 1100   PT Stop Time 1200   PT Time Calculation (min) 60 min   Activity Tolerance Patient tolerated treatment well;No increased pain   Behavior During Therapy [facility] for tasks assessed/performed      Past Medical History  Diagnosis Date  . Cancer [facility])     kidney cancer  . Migraine   . Kidney stone   . Allergy   . Bronchitis   . Bipolar 1 disorder, mixed ([employer]) 2001    11/10: Pt reports she is working with neuropsychiatrist/ psychotherapist for meds managment and regular therapy.     Past Surgical History  Procedure Laterality Date  . Lithotripsy    . Colonoscopy with propofol N/A 07/05/2015    Procedure: COLONOSCOPY WITH PROPOFOL;  Surgeon: Manya Silvas, MD;  Location: [facility] ENDOSCOPY;  Service: Endoscopy;  Laterality: N/A;  . Esophagogastroduodenoscopy  07/05/2015    Procedure: ESOPHAGOGASTRODUODENOSCOPY (EGD);  Surgeon: Manya Silvas, MD;  Location: [facility] ENDOSCOPY;  Service: Endoscopy;;  . Abdominal hysterectomy  2000    R ovary remains (Laproscopic procedure)   . Tubal ligation  1998  . Cholecystectomy  2000  . Removal of r kidney   1999    due to cancer    There were no vitals filed for this visit.  Visit Diagnosis:  Lack of coordination  Abdominal weakness  Weakness of back      Subjective Assessment - 08/23/15 1202    Subjective Pt reported less hip pain after last session but this morning she ended waking up on her R side and has expereincced 6/10 pain radiating to knee.    Pertinent History Pt reports  her abdominopelvic pain started last month which she was seen at the ED and was informed by her PCP (Dr. Vanessa Kick that her pain was related to her R ovarian cyst. Referring provider Dr. Leafy Ro informed pt that the vaginal [place] she ordered visualized 3 cysts on R ovary. Pt was put on metronidazole after her appt for bacterial vaginosis which she has completed this week. Pt was informed she would be put on birth control for management of the cysts but pt prefers for surgurical removal of the cysts and R ovary. Pt will see Dr. Leafy Ro tomorrow discuss further. Pt stated she had nausea and vomiting in August -Sept which has been managed with care of her GI MD (Dr. Tiffany Kocher). Pt reports these Sx have improved  with meds but she did have an episode of vomiting and diarrhea last week for 18 hours.    Patient Stated Goals have sexual intercourse at toelrable pain 5/10, walk with 100# dog, decrease pain to decrease depression    Currently in Pain? Yes   Pain Score 6    Pain Location Hip   Pain Onset More than a month ago            [facility] PT Assessment - 08/23/15 1206    AROM   Overall AROM  --  ROM normal in sidebend. rotation, and forward flexion.+  R sb   Palpation   SI assessment  R sacral limited mobility with mm tension lateral border of PSIS                   Pelvic Floor Special Questions - 08/23/15 0841    Diastasis Recti neg           [facility] Adult PT Treatment/Exercise - 08/23/15 1208    Self-Care   Self-Care --  education on SIJ instability, pelvic girdle mechanics   Therapeutic Activites    Therapeutic Activities --  sit to stand with eider BOS   Exercises   Exercises --  see pt instructions   Moist Heat Therapy   Number Minutes Moist Heat 10 Minutes   Moist Heat Location --  back   Manual Therapy   Joint Mobilization Grade I-II along R PSIS , rotational mob on scarum,    Soft tissue mobilization sustained pressure on mm tensions                 PT  Education - 08/23/15 0935    Education provided Yes   Education Details HEP   Person(s) Educated Patient   Methods Explanation;Demonstration;Tactile cues;Verbal cues;Handout   Comprehension Returned demonstration;Verbalized understanding             PT Long Term Goals - 08/22/15 1122    PT LONG TERM GOAL #1   Title Pt will increase her score on PSFS from sex 0/10, stairclimbing, 3/10, walking > 30 min 3/10 to > 5/10 for all activities to improve QOL.    Time 12   Period Weeks   Status New   PT LONG TERM GOAL #2   Title Pt will demo decreased score on DASS from 44% to < 40%  to show improved self-care and better management with relaxation HEP.    Time 12   Period Weeks   Status New   PT LONG TERM GOAL #3   Title Pt will show decreased score on PSQI from 71% to < 65% in order to show improved sleep.    Time 12   Period Weeks   Status New   PT LONG TERM GOAL #4   Title Pt will report bowel movements occuring from every 3 days to daily in  order to show improved bowel m function.    Time 12   Period Weeks   Status New   PT LONG TERM GOAL #5   Title Pt will report no longer needing to wear a panty liner for 3-5 days  as she is able to control her pelvic floor muscles to prevent urinary leakage.    Time 12   Period Weeks   Status New               Plan - 08/23/15 1212    Clinical Impression Statement Pt demo'd pelvic girdle instability with decreased and centralized pain on R hip from 6/10 to 3/10 post-Tx. Pt tolerated manual Tx, moist heat, and ther ex. Pt requried more propioception training with sit to stand to optimize perlvic girdle stability. Pt will continue to benefit from skilled PT.    Pt will benefit from skilled therapeutic intervention in order to improve on the following deficits Abnormal gait;Hypermobility;Impaired flexibility;Postural dysfunction;Improper body mechanics;Decreased cognition;Decreased coordination;Decreased safety awareness;Decreased  mobility;Decreased balance;Difficulty walking;Increased muscle spasms;Decreased strength;Increased fascial restricitons;Pain;Decreased scar mobility;Decreased endurance   Rehab Potential Good   PT Frequency 2x / week   PT Duration 12 weeks   PT Treatment/Interventions  ADLs/Self Care Home Management;Aquatic Therapy;Electrical Stimulation;Cryotherapy;Biofeedback;Neuromuscular re-education;Patient/family education;Gait training;Stair training;Functional mobility training;Therapeutic exercise;Balance training;Moist Heat;Traction;Manual techniques;Taping;Energy conservation;Dry needling;Passive range of motion;Scar mobilization   Consulted and Agree with Plan of Care Patient        Problem List There are no active problems to display for this patient.   Jerl Mina  ,PT, DPT, E-RYT  08/23/2015, 12:14 PM  [place] MAIN [facility] SERVICES [address] [place], [place], [address] Phone: [phone]   Fax:  [phone]  Name: Shawna Santiago MRN: [id number] Date of Birth: Sep 03, 1966

## 2015-08-23 NOTE — Therapy (Addendum)
[place] MAIN [facility] SERVICES [address] [place], [place], [address] Phone: [phone]   Fax:  [phone]  Physical Therapy Treatment  Patient Details  Name: Shawna Santiago MRN: [id number] Date of Birth: 03/25/1966 Referring Provider: Leafy Ro   Encounter Date: 08/21/2015      PT End of Session - 08/23/15 0938    Visit Number 2   Number of Visits 12   Date for PT Re-Evaluation 10/24/15   PT Start Time 1500   PT Stop Time 1600   PT Time Calculation (min) 60 min   Activity Tolerance Patient tolerated treatment well;No increased pain   Behavior During Therapy [facility] for tasks assessed/performed      Past Medical History  Diagnosis Date  . Cancer [facility])     kidney cancer  . Migraine   . Kidney stone   . Allergy   . Bronchitis   . Bipolar 1 disorder, mixed ([place]) 2001    11/10: Pt reports she is working with neuropsychiatrist/ psychotherapist for meds managment and regular therapy.     Past Surgical History  Procedure Laterality Date  . Lithotripsy    . Colonoscopy with propofol N/A 07/05/2015    Procedure: COLONOSCOPY WITH PROPOFOL;  Surgeon: Manya Silvas, MD;  Location: [facility] ENDOSCOPY;  Service: Endoscopy;  Laterality: N/A;  . Esophagogastroduodenoscopy  07/05/2015    Procedure: ESOPHAGOGASTRODUODENOSCOPY (EGD);  Surgeon: Manya Silvas, MD;  Location: [facility] ENDOSCOPY;  Service: Endoscopy;;  . Abdominal hysterectomy  2000    R ovary remains (Laproscopic procedure)   . Tubal ligation  1998  . Cholecystectomy  2000  . Removal of r kidney   1999    due to cancer    There were no vitals filed for this visit.  Visit Diagnosis:  Lack of coordination  Abdominal weakness  Weakness of back      Subjective Assessment - 08/23/15 0936    Subjective Pt reported her radiating back/hip pain has improved. Pt uses a stool for bowel movements and has found it helpful.     Pertinent History Pt reports her abdominopelvic pain started  last month which she was seen at the ED and was informed by her PCP (Dr. Vanessa Kick that her pain was related to her R ovarian cyst. Referring provider Dr. Leafy Ro informed pt that the vaginal [place] she ordered visualized 3 cysts on R ovary. Pt was put on metronidazole after her appt for bacterial vaginosis which she has completed this week. Pt was informed she would be put on birth control for management of the cysts but pt prefers for surgurical removal of the cysts and R ovary. Pt will see Dr. Leafy Ro tomorrow discuss further. Pt stated she had nausea and vomiting in August -Sept which has been managed with care of her GI MD (Dr. Tiffany Kocher). Pt reports these Sx have improved  with meds but she did have an episode of vomiting and diarrhea last week for 18 hours.    Patient Stated Goals have sexual intercourse at toelrable pain 5/10, walk with 100# dog, decrease pain to decrease depression    Pain Onset More than a month ago            [facility] PT Assessment - 08/23/15 0840    Assessment   Medical Diagnosis pelvic pain    Precautions   Precautions None   Restrictions   Weight Bearing Restrictions No   Home Environment   Additional Comments 13 stairs in home  Prior Function   Level of Independence Independent   Observation/Other Assessments   Observations seated w/ WBing on R hip  slouched   Other Surveys  --  PSFS: sex 0/10, stairclimbing, 3/10, walking > 30 min 3/10   Coordination   Gross Motor Movements are Fluid and Coordinated --  chest breathing   Fine Motor Movements are Fluid and Coordinated --  pelvic floor straining w/ cue for bowel momvent   AROM   Overall AROM  --  spinal flex ~45 deg,sidebend R  rotation L= + pain   Lumbar - Left Side Bend repeated movements 10 reps, centralization to thigh, 2nd set of 10, up to hip    Palpation   Palpation comment scar restriction upper/lower R and flank                  Pelvic Floor Special Questions - 08/23/15 0841     Diastasis Recti neg           [facility] Adult PT Treatment/Exercise - 08/23/15 0840    Bed Mobility   Bed Mobility --  crunch method, able to log roll   Posture/Postural Control   Posture Comments lumbopelvic instability with ASLR   Self-Care   Self-Care --   Neuro Re-ed    Neuro Re-ed Details   scar massage and dyn stabilization 1-2   Lumbar Exercises: Standing   Other Standing Lumbar Exercises --   Manual Therapy   Myofascial Release scar massage and educated pt on self massage                PT Education - 08/23/15 0935    Education provided Yes   Education Details HEP   Person(s) Educated Patient   Methods Explanation;Demonstration;Tactile cues;Verbal cues;Handout   Comprehension Returned demonstration;Verbalized understanding             PT Long Term Goals - 08/22/15 1122    PT LONG TERM GOAL #1   Title Pt will increase her score on PSFS from sex 0/10, stairclimbing, 3/10, walking > 30 min 3/10 to > 5/10 for all activities to improve QOL.    Time 12   Period Weeks   Status New   PT LONG TERM GOAL #2   Title Pt will demo decreased score on DASS from 44% to < 40%  to show improved self-care and better management with relaxation HEP.    Time 12   Period Weeks   Status New   PT LONG TERM GOAL #3   Title Pt will show decreased score on PSQI from 71% to < 65% in order to show improved sleep.    Time 12   Period Weeks   Status New   PT LONG TERM GOAL #4   Title Pt will report bowel movements occuring from every 3 days to daily in  order to show improved bowel m function.    Time 12   Period Weeks   Status New   PT LONG TERM GOAL #5   Title Pt will report no longer needing to wear a panty liner for 3-5 days  as she is able to control her pelvic floor muscles to prevent urinary leakage.    Time 12   Period Weeks   Status New               Plan - 08/23/15 0939    Clinical Impression Statement Pt's radiating back pain resolved and pt progressed  to dynamic stabilization exercises today with proper  technique and minor cuing. Initiated scar massage and plan to introduce global core strengthening after scar shows more mobility. Assess pelvic floor at next session.    Pt will benefit from skilled therapeutic intervention in order to improve on the following deficits Abnormal gait;Hypermobility;Impaired flexibility;Postural dysfunction;Improper body mechanics;Decreased cognition;Decreased coordination;Decreased safety awareness;Decreased mobility;Decreased balance;Difficulty walking;Increased muscle spasms;Decreased strength;Increased fascial restricitons;Pain;Decreased scar mobility;Decreased endurance   Rehab Potential Good   PT Frequency 2x / week   PT Duration 12 weeks   PT Treatment/Interventions ADLs/Self Care Home Management;Aquatic Therapy;Electrical Stimulation;Cryotherapy;Biofeedback;Neuromuscular re-education;Patient/family education;Gait training;Stair training;Functional mobility training;Therapeutic exercise;Balance training;Moist Heat;Traction;Manual techniques;Taping;Energy conservation;Dry needling;Passive range of motion;Scar mobilization   Consulted and Agree with Plan of Care Patient        Problem List There are no active problems to display for this patient.   Jerl Mina ,PT, DPT, E-RYT  08/23/2015, 9:43 AM  [place] MAIN [facility] SERVICES [address] [place], [place], [address] Phone: [phone]   Fax:  [phone]  Name: Shawna Santiago MRN: [id number] Date of Birth: September 09, 1966

## 2015-08-23 NOTE — Patient Instructions (Signed)
You are now ready to begin training the deep core muscles system: diaphragm, transverse abdominis, pelvic floor . These muscles must work together as a team.           The key to these exercises to train the brain to coordinate the timing of these muscles and to have them turn on for long periods of time to hold you upright against gravity (especially important if you are on your feet all day).These muscles are postural muscles and play a role stabilizing your spine and bodyweight. By doing these repetitions slowly and correctly instead of doing crunches, you will achieve a flatter belly without a lower pooch. You are also placing your spine in a more neutral position and breathing properly which in turn, decreases your risk for problems related to your pelvic floor, abdominal, and low back such as pelvic organ prolapse, hernias, diastasis recti (separation of superficial muscles), disk herniations, spinal fractures. These exercises set a solid foundation for you to later progress to resistance/ strength training with therabands and weights and return to other typical fitness exercises with a stronger deeper core.   Level 1-2 this week

## 2015-08-26 ENCOUNTER — Ambulatory Visit: Payer: BLUE CROSS/BLUE SHIELD | Admitting: Physical Therapy

## 2015-08-28 ENCOUNTER — Ambulatory Visit: Payer: BLUE CROSS/BLUE SHIELD | Admitting: Physical Therapy

## 2015-08-28 DIAGNOSIS — R279 Unspecified lack of coordination: Secondary | ICD-10-CM | POA: Diagnosis not present

## 2015-08-28 DIAGNOSIS — R198 Other specified symptoms and signs involving the digestive system and abdomen: Secondary | ICD-10-CM

## 2015-08-28 DIAGNOSIS — R29898 Other symptoms and signs involving the musculoskeletal system: Secondary | ICD-10-CM

## 2015-08-28 NOTE — Therapy (Signed)
[place] MAIN [facility] SERVICES [address] [place], [place], [address] Phone: [phone]   Fax:  [phone]  Physical Therapy Treatment  Patient Details  Name: Shawna Santiago MRN: [id number] Date of Birth: [birth year] Referring Provider: Leafy Ro   Encounter Date: 08/28/2015    Past Medical History  Diagnosis Date  . Cancer [facility])     kidney cancer  . Migraine   . Kidney stone   . Allergy   . Bronchitis   . Bipolar 1 disorder, mixed ([place]) 2001    11/10: Pt reports she is working with neuropsychiatrist/ psychotherapist for meds managment and regular therapy.     Past Surgical History  Procedure Laterality Date  . Lithotripsy    . Colonoscopy with propofol N/A 07/05/2015    Procedure: COLONOSCOPY WITH PROPOFOL;  Surgeon: Manya Silvas, MD;  Location: [facility] ENDOSCOPY;  Service: Endoscopy;  Laterality: N/A;  . Esophagogastroduodenoscopy  07/05/2015    Procedure: ESOPHAGOGASTRODUODENOSCOPY (EGD);  Surgeon: Manya Silvas, MD;  Location: [facility] ENDOSCOPY;  Service: Endoscopy;;  . Abdominal hysterectomy  2000    R ovary remains (Laproscopic procedure)   . Tubal ligation  1998  . Cholecystectomy  2000  . Removal of r kidney   1999    due to cancer    There were no vitals filed for this visit.  Visit Diagnosis:  Lack of coordination  Abdominal weakness  Weakness of back      Subjective Assessment - 08/28/15 1609    Subjective Pt reports her LBP has improved by 75% and found the exercises to "reallly help".  Pt has not had any radiating pain for 4 days. Today her R abdomin pain is at 8/10/ pt states she did more physical activities related to household chores.    Pertinent History Pt reports her abdominopelvic pain started last month which she was seen at the ED and was informed by her PCP (Dr. Vanessa Kick that her pain was related to her R ovarian cyst. Referring provider Dr. Leafy Ro informed pt that the vaginal [place] she ordered  visualized 3 cysts on R ovary. Pt was put on metronidazole after her appt for bacterial vaginosis which she has completed this week. Pt was informed she would be put on birth control for management of the cysts but pt prefers for surgurical removal of the cysts and R ovary. Pt will see Dr. Leafy Ro tomorrow discuss further. Pt stated she had nausea and vomiting in August -Sept which has been managed with care of her GI MD (Dr. Tiffany Kocher). Pt reports these Sx have improved  with meds but she did have an episode of vomiting and diarrhea last week for 18 hours.    Patient Stated Goals have sexual intercourse at toelrable pain 5/10, walk with 100# dog, decrease pain to decrease depression    Pain Onset More than a month ago                         [facility] Adult PT Treatment/Exercise - 08/28/15 1624    Self-Care   Self-Care --  restorative pose : reclined twist w/ heat pack)   Therapeutic Activites    Therapeutic Activities --  body mechanics with sweeping and vaccuming   Neuro Re-ed    Neuro Re-ed Details  educated on calming body with compression of added weights over belly (4 lb), pillow support, covering yes and ears with towel to shut out light and nose (at home use eye  pillow )    Exercises   Exercises --  windhsield wipers 10 reps                 PT Education - 08/28/15 1628    Education provided Yes   Education Details HEP   Person(s) Educated Patient   Methods Explanation;Demonstration;Tactile cues;Verbal cues;Handout   Comprehension Verbalized understanding;Returned demonstration             PT Long Term Goals - 08/28/15 1629    PT LONG TERM GOAL #1   Title Pt will increase her score on PSFS from sex 0/10, stairclimbing, 3/10, walking > 30 min 3/10 to > 5/10 for all activities to improve QOL.    Time 12   Period Weeks   Status New   PT LONG TERM GOAL #2   Title Pt will demo decreased score on DASS from 44% to < 40%  to show improved self-care and  better management with relaxation HEP.    Time 12   Period Weeks   Status New   PT LONG TERM GOAL #3   Title Pt will show decreased score on PSQI from 71% to < 65% in order to show improved sleep.    Time 12   Period Weeks   Status New   PT LONG TERM GOAL #4   Title Pt will report bowel movements occuring from every 3 days to daily in  order to show improved bowel m function.    Time 12   Period Weeks   Status New   PT LONG TERM GOAL #5   Title Pt will report no longer needing to wear a panty liner for 3-5 days  as she is able to control her pelvic floor muscles to prevent urinary leakage.    Time 12   Period Weeks   Status New               Plan - 08/28/15 1716    Clinical Impression Statement Pt felt more relaxed post-Tx today which was primarily focused on teaching pt on how to use restorative yoga pose (reclined twist with props) to realax. Pt's pain over R ovary remained at an 8/10. Withheld manual Tx over abdomen where pt reported pain over ovary.  Pt demo'd proper body mechanics with sweeping and vacuuming to decrease low back and abdominal straining. Anticipate pt will achieve her goals before D/C.     Rehab Potential Good   PT Frequency 2x / week   PT Duration 12 weeks   PT Treatment/Interventions ADLs/Self Care Home Management;Aquatic Therapy;Electrical Stimulation;Cryotherapy;Biofeedback;Neuromuscular re-education;Patient/family education;Gait training;Stair training;Functional mobility training;Therapeutic exercise;Balance training;Moist Heat;Traction;Manual techniques;Taping;Energy conservation;Dry needling;Passive range of motion;Scar mobilization   Consulted and Agree with Plan of Care Patient        Problem List There are no active problems to display for this patient.   Jerl Mina  ,PT, DPT, E-RYT  08/28/2015, 5:19 PM  [place] MAIN [facility] SERVICES [address] [place], [place], [address] Phone:  [phone]   Fax:  [phone]  Name: Shawna Santiago MRN: [id number] Date of Birth: 09-25-1966

## 2015-08-28 NOTE — Patient Instructions (Addendum)
Relaxation poses: Reclined twist   Use 4 lb bean bags over belly or heat pack  Eye pillow over eyes Pillows under knees        Add windshield wipers into stretch routine   Massage over scar only over flank, not over ovary area.  Body mechanics with sweeping (dance with the broom sidestepping) ,

## 2015-09-02 ENCOUNTER — Encounter
Admission: RE | Admit: 2015-09-02 | Discharge: 2015-09-02 | Disposition: A | Discharge status: Home / Self Care | Payer: BLUE CROSS/BLUE SHIELD | Source: Ambulatory Visit | Attending: Obstetrics and Gynecology | Admitting: Obstetrics and Gynecology

## 2015-09-02 DIAGNOSIS — Z01812 Encounter for preprocedural laboratory examination: Secondary | ICD-10-CM | POA: Diagnosis not present

## 2015-09-02 HISTORY — DX: Hyperlipidemia, unspecified: E78.5

## 2015-09-02 HISTORY — DX: Post-traumatic stress disorder, unspecified: F43.10

## 2015-09-02 HISTORY — DX: Unspecified osteoarthritis, unspecified site: M19.90

## 2015-09-02 HISTORY — DX: Anxiety disorder, unspecified: F41.9

## 2015-09-02 HISTORY — DX: Gastro-esophageal reflux disease without esophagitis: K21.9

## 2015-09-02 HISTORY — DX: Attention-deficit hyperactivity disorder, unspecified type: F90.9

## 2015-09-02 LAB — CBC
HEMATOCRIT: 35.9 % (ref 35.0–47.0)
HEMOGLOBIN: 11.8 g/dL — AB (ref 12.0–16.0)
MCH: 31.1 pg (ref 26.0–34.0)
MCHC: 32.8 g/dL (ref 32.0–36.0)
MCV: 94.9 fL (ref 80.0–100.0)
Platelets: 203 10*3/uL (ref 150–440)
RBC: 3.78 MIL/uL — ABNORMAL LOW (ref 3.80–5.20)
RDW: 12.8 % (ref 11.5–14.5)
WBC: 7.9 10*3/uL (ref 3.6–11.0)

## 2015-09-02 LAB — BASIC METABOLIC PANEL
Anion gap: 4 — ABNORMAL LOW (ref 5–15)
BUN: 15 mg/dL (ref 6–20)
CO2: 31 mmol/L (ref 22–32)
CREATININE: 0.74 mg/dL (ref 0.44–1.00)
Calcium: 8.8 mg/dL — ABNORMAL LOW (ref 8.9–10.3)
Chloride: 104 mmol/L (ref 101–111)
GFR calc Af Amer: >60 mL/min (ref >60)
GLUCOSE: 85 mg/dL (ref 65–99)
Potassium: 4.4 mmol/L (ref 3.5–5.1)
Sodium: 139 mmol/L (ref 135–145)

## 2015-09-02 LAB — TYPE AND SCREEN
ABO/RH(D): A POS
ANTIBODY SCREEN: NEGATIVE

## 2015-09-02 LAB — ABO/RH: ABO/RH(D): A POS
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
## 2015-09-02 NOTE — Patient Instructions (Signed)
  Your procedure is scheduled on: September 09, 2015 (Monday) Report to Day Surgery.[facility]) To find out your arrival time please call [phone] between 1PM - 3PM on September 06, 2015(Friday).  Remember: Instructions that are not followed completely may result in serious medical risk, up to and including death, or upon the discretion of your surgeon and anesthesiologist your surgery may need to be rescheduled.    __x__ 1. Do not eat food or drink liquids after midnight. No gum chewing or hard candies.     ____ 2. No Alcohol for 24 hours before or after surgery.   ____ 3. Bring all medications with you on the day of surgery if instructed.    __x__ 4. Notify your doctor if there is any change in your medical condition     (cold, fever, infections).     Do not wear jewelry, make-up, hairpins, clips or nail polish.  Do not wear lotions, powders, or perfumes. You may wear deodorant.  Do not shave 48 hours prior to surgery. Men may shave face and neck.  Do not bring valuables to the hospital.    [facility] is not responsible for any belongings or valuables.               Contacts, dentures or bridgework may not be worn into surgery.  Leave your suitcase in the car. After surgery it may be brought to your room.  For patients admitted to the hospital, discharge time is determined by your                treatment team.   Patients discharged the day of surgery will not be allowed to drive home.   Please read over the following fact sheets that you were given:   Surgical Site Infection Prevention   ____ Take these medicines the morning of surgery with A SIP OF WATER:    1. Crestor  2. Lamictal .   3 Omeprazole (Omeprazole at bedtime Sunday night also)  4   ____ Fleet Enema (as directed)  __x__ Use CHG Soap as directed  ____ Use inhalers on the day of surgery  ____ Stop metformin 2 days prior to surgery    ____ Take 1/2 of usual insulin dose the night before surgery and none  on the morning of surgery.   ____ Stop Coumadin/Plavix/aspirin on   __x__ Stop Anti-inflammatories on (Stop Diclofenac now until  after surgery)   ____ Stop supplements until after surgery.    ____ Bring C-Pap to the hospital.

## 2015-09-03 ENCOUNTER — Ambulatory Visit: Payer: BLUE CROSS/BLUE SHIELD | Admitting: Physical Therapy

## 2015-09-05 ENCOUNTER — Encounter: Payer: BLUE CROSS/BLUE SHIELD | Admitting: Physical Therapy

## 2015-09-06 ENCOUNTER — Ambulatory Visit: Payer: BLUE CROSS/BLUE SHIELD | Attending: Obstetrics and Gynecology | Admitting: Physical Therapy

## 2015-09-09 ENCOUNTER — Ambulatory Visit: Payer: BLUE CROSS/BLUE SHIELD | Admitting: Anesthesiology

## 2015-09-09 ENCOUNTER — Encounter
Admission: RE | Disposition: A | Discharge status: Home / Self Care | Payer: Self-pay | Source: Ambulatory Visit | Attending: Obstetrics and Gynecology

## 2015-09-09 ENCOUNTER — Ambulatory Visit
Admission: RE | Admit: 2015-09-09 | Discharge: 2015-09-09 | Disposition: A | Discharge status: Home / Self Care | Payer: BLUE CROSS/BLUE SHIELD | Source: Ambulatory Visit | Attending: Obstetrics and Gynecology | Admitting: Obstetrics and Gynecology

## 2015-09-09 DIAGNOSIS — F329 Major depressive disorder, single episode, unspecified: Secondary | ICD-10-CM | POA: Diagnosis not present

## 2015-09-09 DIAGNOSIS — E785 Hyperlipidemia, unspecified: Secondary | ICD-10-CM | POA: Insufficient documentation

## 2015-09-09 DIAGNOSIS — Z801 Family history of malignant neoplasm of trachea, bronchus and lung: Secondary | ICD-10-CM | POA: Insufficient documentation

## 2015-09-09 DIAGNOSIS — Z905 Acquired absence of kidney: Secondary | ICD-10-CM | POA: Diagnosis not present

## 2015-09-09 DIAGNOSIS — R102 Pelvic and perineal pain: Secondary | ICD-10-CM | POA: Insufficient documentation

## 2015-09-09 DIAGNOSIS — N8301 Follicular cyst of right ovary: Secondary | ICD-10-CM | POA: Insufficient documentation

## 2015-09-09 DIAGNOSIS — Z8349 Family history of other endocrine, nutritional and metabolic diseases: Secondary | ICD-10-CM | POA: Diagnosis not present

## 2015-09-09 DIAGNOSIS — N736 Female pelvic peritoneal adhesions (postinfective): Secondary | ICD-10-CM | POA: Insufficient documentation

## 2015-09-09 DIAGNOSIS — N838 Other noninflammatory disorders of ovary, fallopian tube and broad ligament: Secondary | ICD-10-CM | POA: Diagnosis not present

## 2015-09-09 DIAGNOSIS — Z85528 Personal history of other malignant neoplasm of kidney: Secondary | ICD-10-CM | POA: Diagnosis not present

## 2015-09-09 DIAGNOSIS — Z800 Family history of malignant neoplasm of digestive organs: Secondary | ICD-10-CM | POA: Diagnosis not present

## 2015-09-09 DIAGNOSIS — Z833 Family history of diabetes mellitus: Secondary | ICD-10-CM | POA: Insufficient documentation

## 2015-09-09 DIAGNOSIS — Z888 Allergy status to other drugs, medicaments and biological substances status: Secondary | ICD-10-CM | POA: Insufficient documentation

## 2015-09-09 DIAGNOSIS — Z90710 Acquired absence of both cervix and uterus: Secondary | ICD-10-CM | POA: Insufficient documentation

## 2015-09-09 DIAGNOSIS — G8929 Other chronic pain: Secondary | ICD-10-CM | POA: Insufficient documentation

## 2015-09-09 HISTORY — PX: LAPAROSCOPIC OOPHERECTOMY: SHX6507

## 2015-09-09 SURGERY — LAPAROSCOPIC OOPHERECTOMY
Anesthesia: General | Site: Abdomen | Laterality: Right | Wound class: Clean Contaminated

## 2015-09-09 MED ORDER — BUPIVACAINE HCL (PF) 0.5 % IJ SOLN
INTRAMUSCULAR | Status: AC
  Filled 2015-09-09: qty 30

## 2015-09-09 MED ORDER — PROPOFOL 10 MG/ML IV BOLUS
INTRAVENOUS | Status: DC | PRN
  Administered 2015-09-09: 150 mg via INTRAVENOUS

## 2015-09-09 MED ORDER — OXYCODONE HCL 5 MG/5ML PO SOLN: 5.0000 mg | Freq: Once | ORAL | Status: AC | PRN

## 2015-09-09 MED ORDER — DOCUSATE SODIUM 100 MG PO CAPS: 100.0000 mg | ORAL_CAPSULE | Freq: Two times a day (BID) | ORAL | Status: DC | PRN

## 2015-09-09 MED ORDER — OXYCODONE HCL 5 MG PO TABS
5.0000 mg | ORAL_TABLET | Freq: Once | ORAL | Status: AC | PRN
  Administered 2015-09-09: 5 mg via ORAL

## 2015-09-09 MED ORDER — GLYCOPYRROLATE 0.2 MG/ML IJ SOLN
INTRAMUSCULAR | Status: DC | PRN
  Administered 2015-09-09: 0.6 mg via INTRAVENOUS

## 2015-09-09 MED ORDER — FENTANYL CITRATE (PF) 100 MCG/2ML IJ SOLN
INTRAMUSCULAR | Status: DC | PRN
  Administered 2015-09-09: 100 ug via INTRAVENOUS

## 2015-09-09 MED ORDER — SUCCINYLCHOLINE CHLORIDE 20 MG/ML IJ SOLN
INTRAMUSCULAR | Status: DC | PRN
  Administered 2015-09-09: 100 mg via INTRAVENOUS

## 2015-09-09 MED ORDER — DEXAMETHASONE SODIUM PHOSPHATE 4 MG/ML IJ SOLN
INTRAMUSCULAR | Status: DC | PRN
  Administered 2015-09-09: 5 mg via INTRAVENOUS

## 2015-09-09 MED ORDER — OXYCODONE HCL 5 MG PO TABS
ORAL_TABLET | ORAL | Status: AC
  Administered 2015-09-09: 5 mg via ORAL
  Filled 2015-09-09: qty 1

## 2015-09-09 MED ORDER — LACTATED RINGERS IV SOLN
INTRAVENOUS | Status: DC
  Administered 2015-09-09 (×2): via INTRAVENOUS

## 2015-09-09 MED ORDER — PHENYLEPHRINE HCL 10 MG/ML IJ SOLN
INTRAMUSCULAR | Status: DC | PRN
  Administered 2015-09-09: 100 ug via INTRAVENOUS

## 2015-09-09 MED ORDER — ACETAMINOPHEN 10 MG/ML IV SOLN
INTRAVENOUS | Status: DC | PRN
  Administered 2015-09-09: 1000 mg via INTRAVENOUS

## 2015-09-09 MED ORDER — LIDOCAINE HCL (CARDIAC) 20 MG/ML IV SOLN
INTRAVENOUS | Status: DC | PRN
  Administered 2015-09-09: 100 mg via INTRAVENOUS

## 2015-09-09 MED ORDER — NEOSTIGMINE METHYLSULFATE 10 MG/10ML IV SOLN
INTRAVENOUS | Status: DC | PRN
  Administered 2015-09-09: 3.5 mg via INTRAVENOUS

## 2015-09-09 MED ORDER — HYDROMORPHONE HCL 1 MG/ML IJ SOLN
INTRAMUSCULAR | Status: DC | PRN
  Administered 2015-09-09: .4 mg via INTRAVENOUS
  Administered 2015-09-09: 0.5 mg via INTRAVENOUS

## 2015-09-09 MED ORDER — METHYLENE BLUE 1 % INJ SOLN
INTRAMUSCULAR | Status: AC
  Filled 2015-09-09: qty 10

## 2015-09-09 MED ORDER — FENTANYL CITRATE (PF) 100 MCG/2ML IJ SOLN
INTRAMUSCULAR | Status: AC
  Administered 2015-09-09: 25 ug via INTRAVENOUS
  Filled 2015-09-09: qty 2

## 2015-09-09 MED ORDER — OXYCODONE-ACETAMINOPHEN 5-325 MG PO TABS: 1.0000 | ORAL_TABLET | Freq: Four times a day (QID) | ORAL | Status: DC | PRN

## 2015-09-09 MED ORDER — DOCUSATE SODIUM 100 MG PO CAPS
100.0000 mg | ORAL_CAPSULE | Freq: Two times a day (BID) | ORAL | Status: DC | PRN
  Filled 2015-09-09: qty 1

## 2015-09-09 MED ORDER — ROCURONIUM BROMIDE 100 MG/10ML IV SOLN
INTRAVENOUS | Status: DC | PRN
  Administered 2015-09-09: 35 mg via INTRAVENOUS

## 2015-09-09 MED ORDER — ACETAMINOPHEN 10 MG/ML IV SOLN
INTRAVENOUS | Status: AC
  Filled 2015-09-09: qty 100

## 2015-09-09 MED ORDER — BUPIVACAINE HCL 0.5 % IJ SOLN
INTRAMUSCULAR | Status: DC | PRN
  Administered 2015-09-09: 20 mL

## 2015-09-09 MED ORDER — FENTANYL CITRATE (PF) 100 MCG/2ML IJ SOLN
25.0000 ug | INTRAMUSCULAR | Status: DC | PRN
  Administered 2015-09-09 (×4): 25 ug via INTRAVENOUS

## 2015-09-09 MED ORDER — ONDANSETRON 4 MG PO TBDP: 4.0000 mg | ORAL_TABLET | Freq: Four times a day (QID) | ORAL | Status: DC | PRN

## 2015-09-09 SURGICAL SUPPLY — 42 items
BAG URO DRAIN 2000ML W/SPOUT (MISCELLANEOUS) ×3 IMPLANT
BLADE SURG SZ11 CARB STEEL (BLADE) ×3 IMPLANT
CATH FOLEY 2WAY  5CC 16FR (CATHETERS) ×2
CATH ROBINSON RED A/P 16FR (CATHETERS) IMPLANT
CATH URTH 16FR FL 2W BLN LF (CATHETERS) ×1 IMPLANT
CHLORAPREP W/TINT 26ML (MISCELLANEOUS) ×6 IMPLANT
CLOSURE WOUND 1/4X4 (GAUZE/BANDAGES/DRESSINGS)
DRAPE UTILITY 15X26 TOWEL STRL (DRAPES) ×3 IMPLANT
DRSG TEGADERM 2-3/8X2-3/4 SM (GAUZE/BANDAGES/DRESSINGS) ×9 IMPLANT
ENDOPOUCH RETRIEVER 10 (MISCELLANEOUS) ×3 IMPLANT
GAUZE SPONGE NON-WVN 2X2 STRL (MISCELLANEOUS) IMPLANT
GLOVE BIO SURGEON STRL SZ 6.5 (GLOVE) ×4 IMPLANT
GLOVE BIO SURGEONS STRL SZ 6.5 (GLOVE) ×2
GLOVE INDICATOR 7.0 STRL GRN (GLOVE) ×3 IMPLANT
GOWN STRL REUS W/ TWL LRG LVL3 (GOWN DISPOSABLE) ×2 IMPLANT
GOWN STRL REUS W/TWL LRG LVL3 (GOWN DISPOSABLE) ×4
GRASPER SUT TROCAR 14GX15 (MISCELLANEOUS) ×3 IMPLANT
IRRIGATION STRYKERFLOW (MISCELLANEOUS) ×1 IMPLANT
IRRIGATOR STRYKERFLOW (MISCELLANEOUS) ×3
IV SOD CHL 0.9% 1000ML (IV SOLUTION) ×3 IMPLANT
KIT RM TURNOVER CYSTO AR (KITS) ×3 IMPLANT
LABEL OR SOLS (LABEL) IMPLANT
LIQUID BAND (GAUZE/BANDAGES/DRESSINGS) ×3 IMPLANT
NS IRRIG 500ML POUR BTL (IV SOLUTION) ×3 IMPLANT
PACK GYN LAPAROSCOPIC (MISCELLANEOUS) ×3 IMPLANT
PAD OB MATERNITY 4.3X12.25 (PERSONAL CARE ITEMS) ×3 IMPLANT
PAD PREP 24X41 OB/GYN DISP (PERSONAL CARE ITEMS) ×3 IMPLANT
SCISSORS METZENBAUM CVD 33 (INSTRUMENTS) IMPLANT
SHEARS HARMONIC ACE PLUS 36CM (ENDOMECHANICALS) ×3 IMPLANT
SLEEVE ENDOPATH XCEL 5M (ENDOMECHANICALS) ×3 IMPLANT
SPONGE VERSALON 2X2 STRL (MISCELLANEOUS)
STRIP CLOSURE SKIN 1/4X4 (GAUZE/BANDAGES/DRESSINGS) IMPLANT
SUT MNCRL AB 4-0 PS2 18 (SUTURE) ×3 IMPLANT
SUT VIC AB 0 CT1 36 (SUTURE) ×3 IMPLANT
SUT VIC AB 2-0 UR6 27 (SUTURE) ×3 IMPLANT
SUT VIC AB 4-0 SH 27 (SUTURE) ×2
SUT VIC AB 4-0 SH 27XANBCTRL (SUTURE) ×1 IMPLANT
SWABSTK COMLB BENZOIN TINCTURE (MISCELLANEOUS) ×3 IMPLANT
TROCAR ENDO BLADELESS 11MM (ENDOMECHANICALS) ×3 IMPLANT
TROCAR XCEL NON-BLD 5MMX100MML (ENDOMECHANICALS) ×3 IMPLANT
TROCAR XCEL UNIV SLVE 11M 100M (ENDOMECHANICALS) ×3 IMPLANT
TUBING INSUFFLATOR HI FLOW (MISCELLANEOUS) ×3 IMPLANT

## 2015-09-09 NOTE — Discharge Instructions (Addendum)
Laparoscopic Ovarian Surgery Discharge Instructions  RISKS AND COMPLICATIONS   Infection.  Bleeding.  Injury to surrounding organs.  Anesthetic side effects.   PROCEDURE   You may be given a medicine to help you relax (sedative) before the procedure. You will be given a medicine to make you sleep (general anesthetic) during the procedure.  A tube will be put down your throat to help your breath while under general anesthesia.  Several small cuts (incisions) are made in the lower abdominal area and one incision is made near the belly button.  Your abdominal area will be inflated with a safe gas (carbon dioxide). This helps give the surgeon room to operate, visualize, and helps the surgeon avoid other organs.  A thin, lighted tube (laparoscope) with a camera attached is inserted into your abdomen through the incision near the belly button. Other small instruments may also be inserted through other abdominal incisions.  The ovary is located and are removed.  After the ovary is removed, the gas is released from the abdomen.  The incisions will be closed with stitches (sutures), and Dermabond. A bandage may be placed over the incisions.  AFTER THE PROCEDURE   You will also have some mild abdominal discomfort for 3-7 days. You will be given pain medicine to ease any discomfort.  As long as there are no problems, you may be allowed to go home. Someone will need to drive you home and be with you for at least 24 hours once home.  You may have some mild discomfort in the throat. This is from the tube placed in your throat while you were sleeping.  You may experience discomfort in the shoulder area from some trapped air between the liver and diaphragm. This sensation is normal and will slowly go away on its own.  HOME CARE INSTRUCTIONS   Take all medicines as directed.  Only take over-the-counter or prescription medicines for pain, discomfort, or fever as directed by your  caregiver.  Resume daily activities as directed.  Showers are preferred over baths for 2 weeks.  You may resume sexual activities in 1 week or as you feel you would like to.  Do not drive while taking narcotics.  SEEK MEDICAL CARE IF: .  There is increasing abdominal pain.  You feel lightheaded or faint.  You have the chills.  You have an oral temperature above 102 F (38.9 C).  There is pus-like (purulent) drainage from any of the wounds.  You are unable to pass gas or have a bowel movement.  You feel sick to your stomach (nauseous) or throw up (vomit) and can't control it with your medicines.  MAKE SURE YOU:   Understand these instructions.  Will watch your condition.  Will get help right away if you are not doing well or get worse.  ExitCare Patient Information 2013 [place].    General Anesthesia, Adult, Care After Refer to this sheet in the next few weeks. These instructions provide you with information on caring for yourself after your procedure. Your health care provider may also give you more specific instructions. Your treatment has been planned according to current medical practices, but problems sometimes occur. Call your health care provider if you have any problems or questions after your procedure. WHAT TO EXPECT AFTER THE PROCEDURE After the procedure, it is typical to experience:  Sleepiness.  Nausea and vomiting. HOME CARE INSTRUCTIONS  For the first 24 hours after general anesthesia:  Have a responsible person with you.  Do not drive a car. If you are alone, do not take public transportation.  Do not drink alcohol.  Do not take medicine that has not been prescribed by your health care provider.  Do not sign important papers or make important decisions.  You may resume a normal diet and activities as directed by your health care provider.  Change bandages (dressings) as directed.  If you have questions or problems that seem  related to general anesthesia, call the hospital and ask for the anesthetist or anesthesiologist on call. SEEK MEDICAL CARE IF:  You have nausea and vomiting that continue the day after anesthesia.  You develop a rash. SEEK IMMEDIATE MEDICAL CARE IF:   You have difficulty breathing.  You have chest pain.  You have any allergic problems.   This information is not intended to replace advice given to you by your health care provider. Make sure you discuss any questions you have with your health care provider.   Document Released: 12/28/2000 Document Revised: 10/12/2014 Document Reviewed: 01/20/2012 Elsevier Interactive Patient Education [employer].

## 2015-09-09 NOTE — Op Note (Signed)
Shawna Santiago PROCEDURE DATE: 09/09/2015  PREOPERATIVE DIAGNOSIS: Chronic pelvic pain, right ovarian cyst POSTOPERATIVE DIAGNOSIS: Same plus Pelvic adhesions PROCEDURE: Diagnostic laparoscopy, lysis of adhesions, right oopherectomy, right salpingectomy SURGEON:  Dr. Benjaman Kindler ASSISTANT: CST  INDICATIONS: 49 y.o. female with history of chronic pelvic pain and ovarian cysts on imaging desiring surgical evaluation.   Please see preoperative notes for further details.  Risks of surgery were discussed with the patient including but not limited to: bleeding which may require transfusion or reoperation; infection which may require antibiotics; injury to bowel, bladder, ureters or other surrounding organs; need for additional procedures including laparotomy; thromboembolic phenomenon, incisional problems and other postoperative/anesthesia complications. Written informed consent was obtained.    FINDINGS:  Pelvic adhesions between rectosigmoid colon and anterior pelvic wall, large paratubal cyst, normal appearing right ovary. The appendix was not visualized. There was white endometriosis vs scar in the pelvic peritoneum.  Peritoneal biopsies were taken and sent to pathology. No other abdominal/pelvic abnormality.  Normal upper abdomen.  ANESTHESIA:    General INTRAVENOUS FLUIDS: 1500 ml ESTIMATED BLOOD LOSS: 10 ml URINE OUTPUT: 250 ml SPECIMENS: Peritoneal biopsies, right ovary and right fallopian tube COMPLICATIONS: None immediate  PROCEDURE IN DETAIL:  The patient had sequential compression devices applied to her lower extremities while in the preoperative area.  She was then taken to the operating room where general anesthesia was administered and was found to be adequate.  She was placed in the dorsal lithotomy position, and was prepped and draped in a sterile manner.  A Foley catheter was inserted into her bladder and attached to constant drainage.    After an adequate timeout was performed,  attention was turned to the abdomen where an umbilical incision was made with the scalpel. Marcaine was infused into all the operative sites prior to incision and again at the end of the case. The Optiview 5-mm trocar and sleeve were then advanced without difficulty with the laparoscope under direct visualization into the abdomen.  The abdomen was then insufflated with carbon dioxide gas and adequate pneumoperitoneum was obtained. A detailed survey of the patient's pelvis and abdomen revealed the findings as mentioned above.  An 73mm-port was placed in the LLQ and a 5 mm port was placed in the RLQ under direct visualization. Biopsy forceps were used to take peritoneal biopsies.  The operative site was surveyed, and it was found to be hemostatic.  No intraoperative injury to surrounding organs was noted.  Pictures were taken of the quadrants and pelvis.   The right ureter was visualized to be out of the operative field. The right ovarian blood supply was identified and fulgurated and transected with the Harmonic device. The bowel adhesions were taken down along the peritoneal wall with the Harmonic. The pressure was dropped to 19mmHg and all operative sites visualized to be hemostatic. The specimen was placed in an Endocatch bag and removed through the 46mm port site.The abdomen was desufflated and all instruments were then removed from the patient's abdomen.   The fascia was closed at the 47mm incision site with 0-Vicryl directly.  All incisions were closed with 4-0 Vicryl and Dermabond.   The patient tolerated the procedures well.  All instruments, needles, and sponge counts were correct x 2. She received Toradol and tylenol prior to leaving the operative suite. The patient was taken to the recovery room in stable condition.

## 2015-09-09 NOTE — Transfer of Care (Signed)
Immediate Anesthesia Transfer of Care Note  Patient: Shawna Santiago  Procedure(s) Performed: Procedure(s): LAPAROSCOPIC OOPHERECTOMY (Right)  Patient Location: PACU  Anesthesia Type:General  Level of Consciousness: sedated  Airway & Oxygen Therapy: Patient Spontanous Breathing and Patient connected to face mask oxygen  Post-op Assessment: Report given to RN and Post -op Vital signs reviewed and stable  Post vital signs: Reviewed and stable  Last Vitals:  Filed Vitals:   09/09/15 0615 09/09/15 0731  BP: 111/64   Pulse: 86   Temp: 36.5 C 36.6 C  Resp: 18     Complications: No apparent anesthesia complications

## 2015-09-09 NOTE — Anesthesia Postprocedure Evaluation (Signed)
Anesthesia Post Note  Patient: Shawna Santiago  Procedure(s) Performed: Procedure(s) (LRB): LAPAROSCOPIC OOPHERECTOMY, left peritinel cyst wall removed (Right)  Patient location during evaluation: PACU Anesthesia Type: General Level of consciousness: awake and alert Pain management: pain level controlled Vital Signs Assessment: post-procedure vital signs reviewed and stable Respiratory status: spontaneous breathing, nonlabored ventilation, respiratory function stable and patient connected to nasal cannula oxygen Cardiovascular status: blood pressure returned to baseline and stable Postop Assessment: no signs of nausea or vomiting Anesthetic complications: no    Last Vitals:  Filed Vitals:   09/09/15 1029 09/09/15 1049  BP: 96/60 104/63  Pulse: 80 84  Temp: 36.1 C 36.6 C  Resp: 16 16    Last Pain:  Filed Vitals:   09/09/15 1051  PainSc: 4                  Joseph K Piscitello

## 2015-09-09 NOTE — Anesthesia Procedure Notes (Signed)
Procedure Name: Intubation Date/Time: 09/09/2015 7:46 AM Performed by: Justus [facility] Pre-anesthesia Checklist: Patient identified, Emergency Drugs available, Suction available and Patient being monitored Patient Re-evaluated:Patient Re-evaluated prior to inductionOxygen Delivery Method: Circle system utilized Preoxygenation: Pre-oxygenation with 100% oxygen Intubation Type: IV induction Ventilation: Mask ventilation without difficulty Laryngoscope Size: Mac and 3 Grade View: Grade I Tube type: Oral Tube size: 7.0 mm Number of attempts: 1 Airway Equipment and Method: Stylet Placement Confirmation: ETT inserted through vocal cords under direct vision,  positive ETCO2,  CO2 detector and breath sounds checked- equal and bilateral Secured at: 21 cm Tube secured with: Tape Dental Injury: Teeth and Oropharynx as per pre-operative assessment

## 2015-09-09 NOTE — Interval H&P Note (Signed)
History and Physical Interval Note:  09/09/2015 7:33 AM  Shawna Santiago  has presented today for surgery, with the diagnosis of PELVIC PAIN WITH COMPLEX CYST  The various methods of treatment have been discussed with the patient and family. After consideration of risks, benefits and other options for treatment, the patient has consented to  Procedure(s): LAPAROSCOPIC OOPHERECTOMY (Right) as a surgical intervention .  She is aware of the possibility of needing an open surgery.The patient's history has been reviewed, patient examined, no change in status, stable for surgery.  I have reviewed the patient's chart and labs.  Questions were answered to the patient's satisfaction.     Benjaman Kindler

## 2015-09-09 NOTE — Anesthesia Preprocedure Evaluation (Signed)
Anesthesia Evaluation  Patient identified by MRN, date of birth, ID band Patient awake    Reviewed: Allergy & Precautions, H&P , NPO status , Patient's Chart, lab work & pertinent test results  History of Anesthesia Complications Negative for: history of anesthetic complications  Airway Mallampati: III  TM Distance: >3 FB Neck ROM: full    Dental no notable dental hx. (+) Teeth Intact   Pulmonary neg pulmonary ROS, neg shortness of breath,    Pulmonary exam normal breath sounds clear to auscultation       Cardiovascular Exercise Tolerance: Good (-) angina(-) Past MI and (-) DOE negative cardio ROS Normal cardiovascular exam Rhythm:regular Rate:Normal     Neuro/Psych  Headaches, PSYCHIATRIC DISORDERS Anxiety Bipolar Disorder    GI/Hepatic negative GI ROS, Neg liver ROS, GERD  Controlled,  Endo/Other  negative endocrine ROS  Renal/GU Renal disease  negative genitourinary   Musculoskeletal   Abdominal   Peds  Hematology negative hematology ROS (+)   Anesthesia Other Findings Past Medical History:   Cancer ([place])                                                   Comment:kidney cancer   Migraine                                                     Kidney stone                                                 Allergy                                                      Bronchitis                                                   Bipolar 1 disorder, mixed ([place])                 2001           Comment:11/10: Pt reports she is working with               neuropsychiatrist/ psychotherapist for meds               managment and regular therapy.    ADHD (attention deficit hyperactivity disorder)              Anxiety                                                      GERD (gastroesophageal reflux disease)  Arthritis                                                    PTSD (post-traumatic stress disorder)                         Hyperlipidemia                                              Past Surgical History:   LITHOTRIPSY                                                   COLONOSCOPY WITH PROPOFOL                       N/A 07/05/2015      Comment:Procedure: COLONOSCOPY WITH PROPOFOL;  Surgeon:              Manya Silvas, MD;  Location: [facility]               ENDOSCOPY;  Service: Endoscopy;  Laterality:               N/A;   ESOPHAGOGASTRODUODENOSCOPY                       07/05/2015      Comment:Procedure: ESOPHAGOGASTRODUODENOSCOPY (EGD);                Surgeon: Manya Silvas, MD;  Location: [facility]               ENDOSCOPY;  Service: Endoscopy;;   ABDOMINAL HYSTERECTOMY                           2000           Comment:R ovary remains (Laproscopic procedure)    [place]                                  2000         removal of R kidney                              1999           Comment:due to cancer  BMI    Body Mass Index   29.49 kg/m 2      Reproductive/Obstetrics negative OB ROS                             Anesthesia Physical Anesthesia Plan  ASA: III  Anesthesia Plan: General ETT   Post-op Pain  Management:    Induction:   Airway Management Planned:   Additional Equipment:   Intra-op Plan:   Post-operative Plan:   Informed Consent: I have reviewed the patients History and Physical, chart, labs and discussed the procedure including the risks, benefits and alternatives for the proposed anesthesia with the patient or authorized representative who has indicated his/her understanding and acceptance.   Dental Advisory Given  Plan Discussed with: Anesthesiologist, CRNA and Surgeon  Anesthesia Plan Comments:         Anesthesia Quick Evaluation

## 2015-09-10 LAB — SURGICAL PATHOLOGY

## 2016-02-19 ENCOUNTER — Other Ambulatory Visit: Payer: Self-pay | Admitting: Internal Medicine

## 2016-02-19 DIAGNOSIS — Z1231 Encounter for screening mammogram for malignant neoplasm of breast: Secondary | ICD-10-CM

## 2016-02-27 ENCOUNTER — Ambulatory Visit
Admission: RE | Admit: 2016-02-27 | Discharge: 2016-02-27 | Disposition: A | Discharge status: Home / Self Care | Payer: BLUE CROSS/BLUE SHIELD | Source: Ambulatory Visit | Attending: Internal Medicine | Admitting: Internal Medicine

## 2016-02-27 DIAGNOSIS — Z1231 Encounter for screening mammogram for malignant neoplasm of breast: Secondary | ICD-10-CM

## 2016-05-05 DIAGNOSIS — M7918 Myalgia, other site: Secondary | ICD-10-CM | POA: Insufficient documentation

## 2016-05-05 DIAGNOSIS — R200 Anesthesia of skin: Secondary | ICD-10-CM | POA: Insufficient documentation

## 2016-05-05 DIAGNOSIS — R202 Paresthesia of skin: Secondary | ICD-10-CM

## 2016-06-04 DIAGNOSIS — G5601 Carpal tunnel syndrome, right upper limb: Secondary | ICD-10-CM | POA: Insufficient documentation

## 2016-06-04 DIAGNOSIS — M5136 Other intervertebral disc degeneration, lumbar region: Secondary | ICD-10-CM | POA: Insufficient documentation

## 2016-11-02 DIAGNOSIS — R1031 Right lower quadrant pain: Secondary | ICD-10-CM | POA: Insufficient documentation

## 2016-11-02 DIAGNOSIS — R1032 Left lower quadrant pain: Secondary | ICD-10-CM

## 2016-11-02 DIAGNOSIS — K219 Gastro-esophageal reflux disease without esophagitis: Secondary | ICD-10-CM | POA: Insufficient documentation

## 2017-01-26 ENCOUNTER — Other Ambulatory Visit: Payer: Self-pay | Admitting: Internal Medicine

## 2017-01-26 DIAGNOSIS — C641 Malignant neoplasm of right kidney, except renal pelvis: Secondary | ICD-10-CM

## 2017-02-04 ENCOUNTER — Ambulatory Visit
Admission: RE | Admit: 2017-02-04 | Discharge: 2017-02-04 | Disposition: A | Discharge status: Home / Self Care | Payer: BLUE CROSS/BLUE SHIELD | Source: Ambulatory Visit | Attending: Internal Medicine | Admitting: Internal Medicine

## 2017-02-04 DIAGNOSIS — C641 Malignant neoplasm of right kidney, except renal pelvis: Secondary | ICD-10-CM | POA: Insufficient documentation

## 2017-02-04 DIAGNOSIS — Z905 Acquired absence of kidney: Secondary | ICD-10-CM | POA: Insufficient documentation

## 2017-04-02 ENCOUNTER — Other Ambulatory Visit: Payer: Self-pay | Admitting: Internal Medicine

## 2017-04-02 DIAGNOSIS — Z1231 Encounter for screening mammogram for malignant neoplasm of breast: Secondary | ICD-10-CM

## 2017-04-28 ENCOUNTER — Ambulatory Visit
Admission: RE | Admit: 2017-04-28 | Discharge: 2017-04-28 | Disposition: A | Discharge status: Home / Self Care | Payer: BLUE CROSS/BLUE SHIELD | Source: Ambulatory Visit | Attending: Internal Medicine | Admitting: Internal Medicine

## 2017-04-28 DIAGNOSIS — Z1231 Encounter for screening mammogram for malignant neoplasm of breast: Secondary | ICD-10-CM | POA: Diagnosis not present

## 2018-05-02 ENCOUNTER — Other Ambulatory Visit: Payer: Self-pay | Admitting: Nurse Practitioner

## 2018-05-02 ENCOUNTER — Ambulatory Visit
Admission: RE | Admit: 2018-05-02 | Discharge: 2018-05-02 | Disposition: A | Discharge status: Home / Self Care | Payer: BLUE CROSS/BLUE SHIELD | Source: Ambulatory Visit | Attending: Nurse Practitioner | Admitting: Nurse Practitioner

## 2018-05-02 DIAGNOSIS — M25512 Pain in left shoulder: Secondary | ICD-10-CM | POA: Diagnosis present

## 2018-05-02 DIAGNOSIS — M479 Spondylosis, unspecified: Secondary | ICD-10-CM

## 2018-05-02 DIAGNOSIS — R52 Pain, unspecified: Secondary | ICD-10-CM

## 2018-05-09 DIAGNOSIS — K582 Mixed irritable bowel syndrome: Secondary | ICD-10-CM | POA: Insufficient documentation

## 2018-10-13 ENCOUNTER — Ambulatory Visit (INDEPENDENT_AMBULATORY_CARE_PROVIDER_SITE_OTHER): Payer: BLUE CROSS/BLUE SHIELD | Admitting: Urology

## 2018-10-13 ENCOUNTER — Encounter

## 2018-10-13 ENCOUNTER — Encounter: Payer: Self-pay | Admitting: Urology

## 2018-10-13 VITALS — BP 110/73 | HR 81 | Ht 61.0 in | Wt 170.2 lb

## 2018-10-13 DIAGNOSIS — N200 Calculus of kidney: Secondary | ICD-10-CM

## 2018-10-13 DIAGNOSIS — R102 Pelvic and perineal pain: Secondary | ICD-10-CM | POA: Insufficient documentation

## 2018-10-13 DIAGNOSIS — Z905 Acquired absence of kidney: Secondary | ICD-10-CM | POA: Diagnosis not present

## 2018-10-13 DIAGNOSIS — F3341 Major depressive disorder, recurrent, in partial remission: Secondary | ICD-10-CM | POA: Insufficient documentation

## 2018-10-13 DIAGNOSIS — F329 Major depressive disorder, single episode, unspecified: Secondary | ICD-10-CM | POA: Insufficient documentation

## 2018-10-13 DIAGNOSIS — E785 Hyperlipidemia, unspecified: Secondary | ICD-10-CM | POA: Insufficient documentation

## 2018-10-13 DIAGNOSIS — F32A Depression, unspecified: Secondary | ICD-10-CM | POA: Insufficient documentation

## 2018-10-13 DIAGNOSIS — C649 Malignant neoplasm of unspecified kidney, except renal pelvis: Secondary | ICD-10-CM | POA: Insufficient documentation

## 2018-10-13 LAB — URINALYSIS, COMPLETE
BILIRUBIN UA: NEGATIVE
Glucose, UA: NEGATIVE
KETONES UA: NEGATIVE
NITRITE UA: POSITIVE — AB
PH UA: 7 (ref 5.0–7.5)
Protein, UA: NEGATIVE
RBC UA: NEGATIVE
SPEC GRAV UA: 1.02 (ref 1.005–1.030)
Urobilinogen, Ur: 1 mg/dL (ref 0.2–1.0)

## 2018-10-13 LAB — MICROSCOPIC EXAMINATION: RBC MICROSCOPIC, UA: NONE SEEN /HPF (ref 0–2)

## 2018-10-13 LAB — BLADDER SCAN AMB NON-IMAGING

## 2018-10-13 MED ORDER — SULFAMETHOXAZOLE-TRIMETHOPRIM 800-160 MG PO TABS: 1.0000 | ORAL_TABLET | Freq: Two times a day (BID) | ORAL | 0 refills | Status: DC

## 2018-10-13 NOTE — Progress Notes (Signed)
10/13/2018 7:55 AM   Shawna Santiago 02/20/1966 [id number]  Referring provider: Idelle Crouch, MD [facility] [place], [place] [address]  Chief Complaint  Patient presents with  . Nephrolithiasis  . Urinary Urgency  . Urinary Frequency    HPI: 53 year old female presents for evaluation of recently passed kidney stones.  She states she passed 6 small stones prior to Christmas.  She had mild left flank pain.  She currently complains of sensation of incomplete emptying.  Denies fever, chills, nausea or vomiting.  Denies severe renal colic.  Past urologic history remarkable for renal cell carcinoma status post right nephrectomy by Dr. Madelin Headings in 1999.  She underwent ureteroscopic removal of a left distal ureteral calculus by Dr. Elnoria Howard in 2014.  Last imaging was a CT of the abdomen pelvis in 2016 which showed no urinary tract calculi.  She also had a renal ultrasound in May 2018 which also showed no calculi.   PMH: Past Medical History:  Diagnosis Date  . ADHD (attention deficit hyperactivity disorder)   . Allergy   . Anxiety   . Arthritis   . Bipolar 1 disorder, mixed ([place]) 2001   11/10: Pt reports she is working with neuropsychiatrist/ psychotherapist for meds managment and regular therapy.   . Bronchitis   . Cancer [facility]) 1999   kidney cancer  . GERD (gastroesophageal reflux disease)   . Hyperlipidemia   . Kidney stone   . Migraine   . PTSD (post-traumatic stress disorder)     Surgical History: Past Surgical History:  Procedure Laterality Date  . ABDOMINAL HYSTERECTOMY  2000   R ovary remains (Laproscopic procedure)   . CHOLECYSTECTOMY  2000  . COLONOSCOPY WITH PROPOFOL N/A 07/05/2015   Procedure: COLONOSCOPY WITH PROPOFOL;  Surgeon: Manya Silvas, MD;  Location: [facility] ENDOSCOPY;  Service: Endoscopy;  Laterality: N/A;  . ESOPHAGOGASTRODUODENOSCOPY  07/05/2015   Procedure: ESOPHAGOGASTRODUODENOSCOPY (EGD);  Surgeon: Manya Silvas, MD;   Location: [place];  Service: Endoscopy;;  . LAPAROSCOPIC OOPHERECTOMY Right 09/09/2015   Procedure: LAPAROSCOPIC OOPHERECTOMY, left peritinel cyst wall removed;  Surgeon: Benjaman Kindler, MD;  Location: [facility] ORS;  Service: Gynecology;  Laterality: Right;  . LITHOTRIPSY    . removal of R kidney   1999   due to cancer  . TUBAL LIGATION  1998    Home Medications:  Allergies as of 10/13/2018      Reactions   Lyrica [pregabalin] Other (See Comments)   halucinations   Stadol [butorphanol] Hives      Medication List       Accurate as of October 13, 2018 11:59 PM. Always use your most recent med list.        ALPRAZolam 0.25 MG tablet Commonly known as:  XANAX Take 0.25 mg by mouth 2 (two) times daily as needed for anxiety. Pt takes 1 tablet every morning and 2 tablets every evening.   amitriptyline 25 MG tablet Commonly known as:  ELAVIL Take by mouth.   amphetamine-dextroamphetamine 30 MG tablet Commonly known as:  ADDERALL Take 30 mg by mouth every evening.   ARIPiprazole 5 MG tablet Commonly known as:  ABILIFY   b complex vitamins capsule Take by mouth.   BLACK COHOSH PO Take by mouth.   cetirizine 10 MG tablet Commonly known as:  ZYRTEC Take 10 mg by mouth daily.   dicyclomine 10 MG capsule Commonly known as:  BENTYL Take 1 capsule (10 mg total) by mouth 4 (four) times daily -  before meals and at bedtime.   estrogens (conjugated) 0.625 MG tablet Commonly known as:  PREMARIN Take by mouth.   lisdexamfetamine 50 MG capsule Commonly known as:  VYVANSE Take 50 mg by mouth daily.   MULTIVITAMIN GUMMIES ADULTS PO Take 2 tablets by mouth daily.   MULTIVITAMIN ADULTS PO Take by mouth.   naltrexone 50 MG tablet Commonly known as:  DEPADE TAKE 1 TABLET BY MOUTH ONCE DAILY BEFORE A MEAL   omeprazole 40 MG capsule Commonly known as:  PRILOSEC Take 40 mg by mouth daily.   propranolol ER 60 MG 24 hr capsule Commonly known as:  INDERAL LA Take by mouth.    rosuvastatin 20 MG tablet Commonly known as:  CRESTOR Take 20 mg by mouth daily.   sulfamethoxazole-trimethoprim 800-160 MG tablet Commonly known as:  BACTRIM DS,SEPTRA DS Take 1 tablet by mouth every 12 (twelve) hours.   tiZANidine 2 MG tablet Commonly known as:  ZANAFLEX Take 2 mg by mouth every 6 (six) hours as needed.   topiramate 25 MG tablet Commonly known as:  TOPAMAX Take by mouth.       Allergies:  Allergies  Allergen Reactions  . Lyrica [Pregabalin] Other (See Comments)    halucinations  . Stadol [Butorphanol] Hives    Family History: Family History  Problem Relation Age of Onset  . Diabetes Mother   . Cancer Father   . Bipolar disorder Father   . Depression Father   . Breast cancer Paternal Grandmother     Social History:  reports that she has never smoked. She has never used smokeless tobacco. She reports that she does not drink alcohol or use drugs.  ROS: UROLOGY Frequent Urination?: Yes Hard to postpone urination?: No Burning/pain with urination?: No Get up at night to urinate?: Yes Leakage of urine?: No Urine stream starts and stops?: Yes Trouble starting stream?: Yes Do you have to strain to urinate?: No Blood in urine?: Yes Urinary tract infection?: No Sexually transmitted disease?: No Injury to kidneys or bladder?: No Painful intercourse?: No Weak stream?: Yes Currently pregnant?: No Vaginal bleeding?: No Last menstrual period?: Hysterectomy  Gastrointestinal Nausea?: No Vomiting?: No Indigestion/heartburn?: No Diarrhea?: No Constipation?: No  Constitutional Fever: No Night sweats?: No Weight loss?: No Fatigue?: No  Skin Skin rash/lesions?: No Itching?: No  Eyes Blurred vision?: No Double vision?: No  Ears/Nose/Throat Sore throat?: No Sinus problems?: No  Hematologic/Lymphatic Swollen glands?: No Easy bruising?: Yes  Cardiovascular Leg swelling?: No Chest pain?: No  Respiratory Cough?: No Shortness of  breath?: No  Endocrine Excessive thirst?: No  Musculoskeletal Back pain?: No Joint pain?: No  Neurological Headaches?: No Dizziness?: Yes  Psychologic Depression?: Yes Anxiety?: Yes  Physical Exam: BP 110/73 (BP Location: Left Arm, Patient Position: Sitting, Cuff Size: Normal)   Pulse 81   Ht 5\' 1"  (1.549 m)   Wt 170 lb 3.2 oz (77.2 kg)   BMI 32.16 kg/m   Constitutional:  Alert and oriented, No acute distress. HEENT: [place] AT, moist mucus membranes.  Trachea midline, no masses. Cardiovascular: No clubbing, cyanosis, or edema. Respiratory: Normal respiratory effort, no increased work of breathing. GI: Abdomen is soft, nontender, nondistended, no abdominal masses GU: No CVA tenderness Lymph: No cervical or inguinal lymphadenopathy. Skin: No rashes, bruises or suspicious lesions. Neurologic: Grossly intact, no focal deficits, moving all 4 extremities. Psychiatric: Normal mood and affect.  Laboratory Data:  Urinalysis Dipstick 1+ leukocytes/nitrite positive Microscopy 6-10 WBC  Assessment & Plan:   53 year old female with  a solitary kidney status post right nephrectomy with history of recent passage of multiple small stones.  Her last upper tract imaging was in 2018.  She has mild lower urinary tract symptoms and urinalysis does show pyuria and is nitrite positive.  I recommended scheduling a stone protocol CT of the abdomen and pelvis.  Check BUN and creatinine today.  Urine was cultured and will send antibiotic Rx since her culture will not be back until Monday.   Abbie Sons, [place] [address] [place], [place] [address] [phone]

## 2018-10-14 ENCOUNTER — Encounter: Payer: Self-pay | Admitting: Urology

## 2018-10-14 ENCOUNTER — Telehealth: Payer: Self-pay

## 2018-10-14 LAB — BASIC METABOLIC PANEL
BUN / CREAT RATIO: 17 (ref 9–23)
BUN: 15 mg/dL (ref 6–24)
CALCIUM: 8.8 mg/dL (ref 8.7–10.2)
CO2: 24 mmol/L (ref 20–29)
Chloride: 107 mmol/L — ABNORMAL HIGH (ref 96–106)
Creatinine, Ser: 0.87 mg/dL (ref 0.57–1.00)
GFR, EST AFRICAN AMERICAN: 90 mL/min/{1.73_m2} (ref >59)
GFR, EST NON AFRICAN AMERICAN: 78 mL/min/{1.73_m2} (ref >59)
Glucose: 91 mg/dL (ref 65–99)
POTASSIUM: 4.7 mmol/L (ref 3.5–5.2)
Sodium: 141 mmol/L (ref 134–144)

## 2018-10-14 NOTE — Telephone Encounter (Signed)
-----   Message from Abbie Sons, MD sent at 10/14/2018  8:22 AM EST ----- Kidney function was normal-creatinine was 0.87

## 2018-10-14 NOTE — Telephone Encounter (Signed)
Called pt informed her of the information below. Pt gave verbal understanding.  

## 2018-10-17 ENCOUNTER — Telehealth: Payer: Self-pay

## 2018-10-17 LAB — CULTURE, URINE COMPREHENSIVE

## 2018-10-17 NOTE — Telephone Encounter (Signed)
-----   Message from Abbie Sons, MD sent at 10/17/2018  7:12 AM EST ----- Urine culture was positive and sensitive to prescribed antibiotic

## 2018-10-17 NOTE — Telephone Encounter (Signed)
Left message for patient to call office so we can inform her of results.

## 2018-10-17 NOTE — Telephone Encounter (Signed)
Duplicate made in error 

## 2018-10-31 ENCOUNTER — Ambulatory Visit
Admission: RE | Admit: 2018-10-31 | Discharge: 2018-10-31 | Disposition: A | Discharge status: Home / Self Care | Payer: BLUE CROSS/BLUE SHIELD | Source: Ambulatory Visit | Attending: Urology | Admitting: Urology

## 2018-10-31 DIAGNOSIS — N200 Calculus of kidney: Secondary | ICD-10-CM | POA: Insufficient documentation

## 2018-11-02 ENCOUNTER — Telehealth: Payer: Self-pay

## 2018-11-02 NOTE — Telephone Encounter (Signed)
Pt returned your call, call back please.

## 2018-11-02 NOTE — Telephone Encounter (Signed)
-----   Message from Abbie Sons, MD sent at 11/02/2018  8:22 AM EST ----- CT showed a small 2 mm nonobstructing left renal calculus.  No ureteral calculi or evidence of kidney blockage. Recommend a follow-up appointment with [facility] in 1 year.

## 2018-11-02 NOTE — Telephone Encounter (Signed)
Left message for patient to return call to office. 

## 2018-11-03 ENCOUNTER — Other Ambulatory Visit: Payer: Self-pay

## 2018-11-03 DIAGNOSIS — N200 Calculus of kidney: Secondary | ICD-10-CM

## 2018-11-03 NOTE — Telephone Encounter (Signed)
Returned call to patient regarding CT results. Read patient note from Dr Bernardo Heater and scheduled appt for 1 yr follow up. Put in [facility] order and instructed patient to have done before appt, recommended Fri prior or even first thing that morning before her appt. Patient verbalized understanding and confirmed appt.

## 2018-11-03 NOTE — Telephone Encounter (Signed)
Called and left message on patients cell and home numbers.

## 2019-02-15 ENCOUNTER — Other Ambulatory Visit (HOSPITAL_COMMUNITY): Payer: Self-pay | Admitting: Neurology

## 2019-02-15 ENCOUNTER — Other Ambulatory Visit: Payer: Self-pay | Admitting: Neurology

## 2019-02-15 DIAGNOSIS — R413 Other amnesia: Secondary | ICD-10-CM

## 2019-03-06 ENCOUNTER — Ambulatory Visit: Payer: Medicare Other

## 2019-03-12 ENCOUNTER — Ambulatory Visit
Admission: RE | Admit: 2019-03-12 | Discharge: 2019-03-12 | Disposition: A | Discharge status: Home / Self Care | Payer: BC Managed Care – PPO | Source: Ambulatory Visit | Attending: Neurology | Admitting: Neurology

## 2019-03-12 ENCOUNTER — Other Ambulatory Visit: Payer: Self-pay

## 2019-03-12 DIAGNOSIS — R413 Other amnesia: Secondary | ICD-10-CM | POA: Insufficient documentation

## 2019-03-27 ENCOUNTER — Other Ambulatory Visit: Payer: Self-pay | Admitting: Internal Medicine

## 2019-03-27 DIAGNOSIS — Z1231 Encounter for screening mammogram for malignant neoplasm of breast: Secondary | ICD-10-CM

## 2019-03-29 ENCOUNTER — Other Ambulatory Visit: Payer: Self-pay

## 2019-03-29 ENCOUNTER — Ambulatory Visit
Admission: RE | Admit: 2019-03-29 | Discharge: 2019-03-29 | Disposition: A | Discharge status: Home / Self Care | Payer: BC Managed Care – PPO | Source: Ambulatory Visit | Attending: Internal Medicine | Admitting: Internal Medicine

## 2019-03-29 DIAGNOSIS — Z1231 Encounter for screening mammogram for malignant neoplasm of breast: Secondary | ICD-10-CM | POA: Insufficient documentation

## 2019-05-02 ENCOUNTER — Other Ambulatory Visit: Payer: Self-pay | Admitting: Surgery

## 2019-05-02 DIAGNOSIS — M7582 Other shoulder lesions, left shoulder: Secondary | ICD-10-CM

## 2019-05-02 DIAGNOSIS — S43432A Superior glenoid labrum lesion of left shoulder, initial encounter: Secondary | ICD-10-CM

## 2019-05-10 ENCOUNTER — Ambulatory Visit
Admission: RE | Admit: 2019-05-10 | Discharge: 2019-05-10 | Disposition: A | Discharge status: Home / Self Care | Payer: BC Managed Care – PPO | Source: Ambulatory Visit | Attending: Surgery | Admitting: Surgery

## 2019-05-10 ENCOUNTER — Other Ambulatory Visit: Payer: Self-pay

## 2019-05-10 DIAGNOSIS — M7582 Other shoulder lesions, left shoulder: Secondary | ICD-10-CM | POA: Diagnosis present

## 2019-05-10 DIAGNOSIS — S43432A Superior glenoid labrum lesion of left shoulder, initial encounter: Secondary | ICD-10-CM | POA: Diagnosis present

## 2019-08-08 ENCOUNTER — Other Ambulatory Visit: Payer: Self-pay | Admitting: Surgery

## 2019-08-22 ENCOUNTER — Encounter
Admission: RE | Admit: 2019-08-22 | Discharge: 2019-08-22 | Disposition: A | Discharge status: Home / Self Care | Payer: BC Managed Care – PPO | Source: Ambulatory Visit | Attending: Surgery | Admitting: Surgery

## 2019-08-22 ENCOUNTER — Other Ambulatory Visit: Payer: Self-pay

## 2019-08-22 DIAGNOSIS — Z01818 Encounter for other preprocedural examination: Secondary | ICD-10-CM | POA: Diagnosis present

## 2019-08-22 HISTORY — DX: Depression, unspecified: F32.A

## 2019-08-22 HISTORY — DX: Personal history of urinary calculi: Z87.442

## 2019-08-22 NOTE — Patient Instructions (Signed)
Your procedure is scheduled on: Tues 11/24 Report to Day Surgery. To find out your arrival time please call [phone] between 1PM - 3PM on Mon 11/23.  Remember: Instructions that are not followed completely may result in serious medical risk,  up to and including death, or upon the discretion of your surgeon and anesthesiologist your  surgery may need to be rescheduled.     _X__ 1. Do not eat food after midnight the night before your procedure.                 No gum chewing or hard candies. You may drink clear liquids up to 2 hours                 before you are scheduled to arrive for your surgery- DO not drink clear                 liquids within 2 hours of the start of your surgery.                 Clear Liquids include:  water, apple juice without pulp, clear carbohydrate                 drink such as Clearfast of Gatorade, Black Coffee or Tea (Do not add                 anything to coffee or tea).  __X__2.  On the morning of surgery brush your teeth with toothpaste and water, you                may rinse your mouth with mouthwash if you wish.  Do not swallow any toothpaste of mouthwash.     ___ 3.  No Alcohol for 24 hours before or after surgery.   ___ 4.  Do Not Smoke or use e-cigarettes For 24 Hours Prior to Your Surgery.                 Do not use any chewable tobacco products for at least 6 hours prior to                 surgery.  ____  5.  Bring all medications with you on the day of surgery if instructed.   __x__  6.  Notify your doctor if there is any change in your medical condition      (cold, fever, infections).     Do not wear jewelry, make-up, hairpins, clips or nail polish. Do not wear lotions, powders, or perfumes. You may wear deodorant. Do not shave 48 hours prior to surgery. Men may shave face and neck. Do not bring valuables to the hospital.    [facility] is not responsible for any belongings or valuables.  Contacts, dentures  or bridgework may not be worn into surgery. Leave your suitcase in the car. After surgery it may be brought to your room. For patients admitted to the hospital, discharge time is determined by your treatment team.   Patients discharged the day of surgery will not be allowed to drive home.   Please read over the following fact sheets that you were given:     __x__ Take these medicines the morning of surgery with A SIP OF WATER:    1. ALPRAZolam (XANAX) 0.25 MG tablet  2. BuPROPion HBr (APLENZIN) 522 MG TB24  3. cetirizine (ZYRTEC) 10 MG tablet  4.Cyanocobalamin (B-12) 5000 MCG CAPS  5.dicyclomine (BENTYL) 10 MG capsule  6.estradiol (  ESTRACE) 2 MG tablet             7.omeprazole (PRILOSEC) 40 MG capsule  Take the night before and morning of surgery             8.rosuvastatin (CRESTOR) 20 MG tablet  ____ Fleet Enema (as directed)   __x__ Use CHG Soap as directed  ____ Use inhalers on the day of surgery  ____ Stop metformin 2 days prior to surgery    ____ Take 1/2 of usual insulin dose the night before surgery. No insulin the morning          of surgery.   ____ Stop Coumadin/Plavix/aspirin on   __x__ Stop Anti-inflammatories diclofenac (CATAFLAM) 50 MG tablet today   ____ Stop supplements until after surgery.    ____ Bring C-Pap to the hospital.

## 2019-08-25 ENCOUNTER — Other Ambulatory Visit
Admission: RE | Admit: 2019-08-25 | Discharge: 2019-08-25 | Disposition: A | Discharge status: Home / Self Care | Payer: BC Managed Care – PPO | Source: Ambulatory Visit | Attending: Surgery | Admitting: Surgery

## 2019-08-25 ENCOUNTER — Other Ambulatory Visit: Payer: Self-pay

## 2019-08-25 DIAGNOSIS — Z01812 Encounter for preprocedural laboratory examination: Secondary | ICD-10-CM | POA: Insufficient documentation

## 2019-08-25 DIAGNOSIS — Z20828 Contact with and (suspected) exposure to other viral communicable diseases: Secondary | ICD-10-CM | POA: Diagnosis not present

## 2019-08-25 LAB — SARS CORONAVIRUS 2 (TAT 6-24 HRS): SARS Coronavirus 2: NEGATIVE

## 2019-08-29 ENCOUNTER — Ambulatory Visit: Payer: BC Managed Care – PPO | Admitting: Anesthesiology

## 2019-08-29 ENCOUNTER — Ambulatory Visit: Payer: BC Managed Care – PPO

## 2019-08-29 ENCOUNTER — Other Ambulatory Visit: Payer: Self-pay

## 2019-08-29 ENCOUNTER — Encounter: Payer: Self-pay | Admitting: *Deleted

## 2019-08-29 ENCOUNTER — Encounter
Admission: RE | Disposition: A | Discharge status: Home / Self Care | Payer: Self-pay | Source: Home / Self Care | Attending: Surgery

## 2019-08-29 ENCOUNTER — Ambulatory Visit
Admission: RE | Admit: 2019-08-29 | Discharge: 2019-08-29 | Disposition: A | Discharge status: Home / Self Care | Payer: BC Managed Care – PPO | Attending: Surgery | Admitting: Surgery

## 2019-08-29 DIAGNOSIS — Z905 Acquired absence of kidney: Secondary | ICD-10-CM | POA: Insufficient documentation

## 2019-08-29 DIAGNOSIS — M75122 Complete rotator cuff tear or rupture of left shoulder, not specified as traumatic: Secondary | ICD-10-CM | POA: Diagnosis present

## 2019-08-29 DIAGNOSIS — Z85528 Personal history of other malignant neoplasm of kidney: Secondary | ICD-10-CM | POA: Insufficient documentation

## 2019-08-29 DIAGNOSIS — Z8249 Family history of ischemic heart disease and other diseases of the circulatory system: Secondary | ICD-10-CM | POA: Diagnosis not present

## 2019-08-29 DIAGNOSIS — E785 Hyperlipidemia, unspecified: Secondary | ICD-10-CM | POA: Diagnosis not present

## 2019-08-29 DIAGNOSIS — Z800 Family history of malignant neoplasm of digestive organs: Secondary | ICD-10-CM | POA: Insufficient documentation

## 2019-08-29 DIAGNOSIS — Z87442 Personal history of urinary calculi: Secondary | ICD-10-CM | POA: Insufficient documentation

## 2019-08-29 DIAGNOSIS — Z801 Family history of malignant neoplasm of trachea, bronchus and lung: Secondary | ICD-10-CM | POA: Insufficient documentation

## 2019-08-29 DIAGNOSIS — R519 Headache, unspecified: Secondary | ICD-10-CM | POA: Insufficient documentation

## 2019-08-29 DIAGNOSIS — Z79899 Other long term (current) drug therapy: Secondary | ICD-10-CM | POA: Insufficient documentation

## 2019-08-29 DIAGNOSIS — M7542 Impingement syndrome of left shoulder: Secondary | ICD-10-CM | POA: Insufficient documentation

## 2019-08-29 DIAGNOSIS — M1990 Unspecified osteoarthritis, unspecified site: Secondary | ICD-10-CM | POA: Insufficient documentation

## 2019-08-29 DIAGNOSIS — F909 Attention-deficit hyperactivity disorder, unspecified type: Secondary | ICD-10-CM | POA: Insufficient documentation

## 2019-08-29 DIAGNOSIS — K589 Irritable bowel syndrome without diarrhea: Secondary | ICD-10-CM | POA: Insufficient documentation

## 2019-08-29 DIAGNOSIS — Z888 Allergy status to other drugs, medicaments and biological substances status: Secondary | ICD-10-CM | POA: Insufficient documentation

## 2019-08-29 DIAGNOSIS — F419 Anxiety disorder, unspecified: Secondary | ICD-10-CM | POA: Diagnosis not present

## 2019-08-29 DIAGNOSIS — Z833 Family history of diabetes mellitus: Secondary | ICD-10-CM | POA: Insufficient documentation

## 2019-08-29 DIAGNOSIS — Z9049 Acquired absence of other specified parts of digestive tract: Secondary | ICD-10-CM | POA: Insufficient documentation

## 2019-08-29 DIAGNOSIS — M7582 Other shoulder lesions, left shoulder: Secondary | ICD-10-CM | POA: Insufficient documentation

## 2019-08-29 DIAGNOSIS — K219 Gastro-esophageal reflux disease without esophagitis: Secondary | ICD-10-CM | POA: Insufficient documentation

## 2019-08-29 DIAGNOSIS — Z90710 Acquired absence of both cervix and uterus: Secondary | ICD-10-CM | POA: Insufficient documentation

## 2019-08-29 DIAGNOSIS — F4310 Post-traumatic stress disorder, unspecified: Secondary | ICD-10-CM | POA: Diagnosis not present

## 2019-08-29 DIAGNOSIS — F319 Bipolar disorder, unspecified: Secondary | ICD-10-CM | POA: Diagnosis not present

## 2019-08-29 HISTORY — PX: SHOULDER ARTHROSCOPY WITH SUBACROMIAL DECOMPRESSION AND OPEN ROTATOR C: SHX5688

## 2019-08-29 SURGERY — SHOULDER ARTHROSCOPY WITH SUBACROMIAL DECOMPRESSION AND OPEN ROTATOR CUFF REPAIR, OPEN BICEPS TENDON REPAIR
Anesthesia: General | Site: Shoulder | Laterality: Left

## 2019-08-29 MED ORDER — MIDAZOLAM HCL 2 MG/2ML IJ SOLN
INTRAMUSCULAR | Status: AC
  Filled 2019-08-29: qty 2

## 2019-08-29 MED ORDER — LACTATED RINGERS IV SOLN
INTRAVENOUS | Status: DC | PRN
  Administered 2019-08-29: 14:00:00 via INTRAVENOUS

## 2019-08-29 MED ORDER — MIDAZOLAM HCL 2 MG/2ML IJ SOLN
INTRAMUSCULAR | Status: DC | PRN
  Administered 2019-08-29: 1 mg via INTRAVENOUS

## 2019-08-29 MED ORDER — PHENYLEPHRINE HCL (PRESSORS) 10 MG/ML IV SOLN
INTRAVENOUS | Status: DC | PRN
  Administered 2019-08-29: 50 ug via INTRAVENOUS
  Administered 2019-08-29 (×4): 100 ug via INTRAVENOUS

## 2019-08-29 MED ORDER — PROMETHAZINE HCL 25 MG/ML IJ SOLN: 6.2500 mg | INTRAMUSCULAR | Status: DC | PRN

## 2019-08-29 MED ORDER — CEFAZOLIN SODIUM-DEXTROSE 2-4 GM/100ML-% IV SOLN
INTRAVENOUS | Status: AC
  Filled 2019-08-29: qty 100

## 2019-08-29 MED ORDER — FENTANYL CITRATE (PF) 100 MCG/2ML IJ SOLN: 25.0000 ug | INTRAMUSCULAR | Status: DC | PRN

## 2019-08-29 MED ORDER — LACTATED RINGERS IV SOLN
INTRAVENOUS | Status: DC
  Administered 2019-08-29: 13:00:00 via INTRAVENOUS

## 2019-08-29 MED ORDER — FENTANYL CITRATE (PF) 100 MCG/2ML IJ SOLN
INTRAMUSCULAR | Status: AC
  Administered 2019-08-29: 13:00:00 50 ug via INTRAVENOUS
  Filled 2019-08-29: qty 2

## 2019-08-29 MED ORDER — BUPIVACAINE HCL (PF) 0.5 % IJ SOLN
INTRAMUSCULAR | Status: AC
  Filled 2019-08-29: qty 10

## 2019-08-29 MED ORDER — EPINEPHRINE PF 1 MG/ML IJ SOLN
INTRAMUSCULAR | Status: AC
  Filled 2019-08-29: qty 1

## 2019-08-29 MED ORDER — CEFAZOLIN SODIUM-DEXTROSE 2-4 GM/100ML-% IV SOLN
2.0000 g | INTRAVENOUS | Status: AC
  Administered 2019-08-29: 2 g via INTRAVENOUS

## 2019-08-29 MED ORDER — DEXAMETHASONE SODIUM PHOSPHATE 10 MG/ML IJ SOLN
INTRAMUSCULAR | Status: DC | PRN
  Administered 2019-08-29: 10 mg via INTRAVENOUS

## 2019-08-29 MED ORDER — LIDOCAINE HCL (PF) 1 % IJ SOLN
INTRAMUSCULAR | Status: AC
  Filled 2019-08-29: qty 5

## 2019-08-29 MED ORDER — OXYCODONE HCL 5 MG PO TABS: 5.0000 mg | ORAL_TABLET | ORAL | 0 refills | Status: AC | PRN

## 2019-08-29 MED ORDER — FENTANYL CITRATE (PF) 100 MCG/2ML IJ SOLN
50.0000 ug | Freq: Once | INTRAMUSCULAR | Status: AC
  Administered 2019-08-29: 13:00:00 50 ug via INTRAVENOUS

## 2019-08-29 MED ORDER — ONDANSETRON HCL 4 MG/2ML IJ SOLN
INTRAMUSCULAR | Status: DC | PRN
  Administered 2019-08-29: 4 mg via INTRAVENOUS

## 2019-08-29 MED ORDER — SUGAMMADEX SODIUM 200 MG/2ML IV SOLN
INTRAVENOUS | Status: DC | PRN
  Administered 2019-08-29: 166 mg via INTRAVENOUS

## 2019-08-29 MED ORDER — FENTANYL CITRATE (PF) 100 MCG/2ML IJ SOLN
INTRAMUSCULAR | Status: AC
  Filled 2019-08-29: qty 2

## 2019-08-29 MED ORDER — BUPIVACAINE-EPINEPHRINE 0.5% -1:200000 IJ SOLN
INTRAMUSCULAR | Status: DC | PRN
  Administered 2019-08-29: 30 mL

## 2019-08-29 MED ORDER — PROPOFOL 10 MG/ML IV BOLUS
INTRAVENOUS | Status: DC | PRN
  Administered 2019-08-29: 170 mg via INTRAVENOUS

## 2019-08-29 MED ORDER — CHLORHEXIDINE GLUCONATE 4 % EX LIQD: 60.0000 mL | Freq: Once | CUTANEOUS | Status: DC

## 2019-08-29 MED ORDER — FENTANYL CITRATE (PF) 100 MCG/2ML IJ SOLN
INTRAMUSCULAR | Status: DC | PRN
  Administered 2019-08-29: 50 ug via INTRAVENOUS

## 2019-08-29 MED ORDER — EPHEDRINE SULFATE 50 MG/ML IJ SOLN
INTRAMUSCULAR | Status: DC | PRN
  Administered 2019-08-29 (×5): 5 mg via INTRAVENOUS

## 2019-08-29 MED ORDER — LIDOCAINE HCL (PF) 1 % IJ SOLN
INTRAMUSCULAR | Status: DC | PRN
  Administered 2019-08-29: 5 mL
  Administered 2019-08-29: 5 mL via SUBCUTANEOUS

## 2019-08-29 MED ORDER — BUPIVACAINE HCL (PF) 0.5 % IJ SOLN
INTRAMUSCULAR | Status: DC | PRN
  Administered 2019-08-29: 10 mL via PERINEURAL

## 2019-08-29 MED ORDER — ROCURONIUM BROMIDE 50 MG/5ML IV SOLN
INTRAVENOUS | Status: AC
  Filled 2019-08-29: qty 1

## 2019-08-29 MED ORDER — MIDAZOLAM HCL 2 MG/2ML IJ SOLN
1.0000 mg | Freq: Once | INTRAMUSCULAR | Status: AC
  Administered 2019-08-29: 13:00:00 1 mg via INTRAVENOUS

## 2019-08-29 MED ORDER — PROPOFOL 10 MG/ML IV BOLUS
INTRAVENOUS | Status: AC
  Filled 2019-08-29: qty 40

## 2019-08-29 MED ORDER — LACTATED RINGERS IV SOLN
INTRAVENOUS | Status: DC | PRN
  Administered 2019-08-29: 1 mL

## 2019-08-29 MED ORDER — SUGAMMADEX SODIUM 200 MG/2ML IV SOLN
INTRAVENOUS | Status: AC
  Filled 2019-08-29: qty 2

## 2019-08-29 MED ORDER — ROCURONIUM BROMIDE 100 MG/10ML IV SOLN
INTRAVENOUS | Status: DC | PRN
  Administered 2019-08-29: 40 mg via INTRAVENOUS

## 2019-08-29 MED ORDER — BUPIVACAINE LIPOSOME 1.3 % IJ SUSP
INTRAMUSCULAR | Status: AC
  Filled 2019-08-29: qty 20

## 2019-08-29 MED ORDER — MIDAZOLAM HCL 2 MG/2ML IJ SOLN
INTRAMUSCULAR | Status: AC
  Administered 2019-08-29: 1 mg via INTRAVENOUS
  Filled 2019-08-29: qty 2

## 2019-08-29 MED ORDER — BUPIVACAINE LIPOSOME 1.3 % IJ SUSP
INTRAMUSCULAR | Status: DC | PRN
  Administered 2019-08-29: 20 mL via PERINEURAL

## 2019-08-29 SURGICAL SUPPLY — 55 items
ANCHOR ALL-SUT Q-FIX 2.8 (Anchor) ×6 IMPLANT
ANCHOR HEALICOIL REGEN 5.5 (Anchor) ×6 IMPLANT
ANCHOR JUGGERKNOT WTAP NDL 2.9 (Anchor) ×3 IMPLANT
BIT DRILL JUGRKNT W/NDL BIT2.9 (DRILL) ×1 IMPLANT
BLADE FULL RADIUS 3.5 (BLADE) ×3 IMPLANT
BUR ACROMIONIZER 4.0 (BURR) ×3 IMPLANT
CANNULA SHAVER 8MMX76MM (CANNULA) ×3 IMPLANT
CHLORAPREP W/TINT 26 (MISCELLANEOUS) ×3 IMPLANT
COVER MAYO STAND REUSABLE (DRAPES) ×3 IMPLANT
COVER WAND RF STERILE (DRAPES) ×3 IMPLANT
DILATOR 5.5 THREADED HEALICOIL (MISCELLANEOUS) ×3 IMPLANT
DRAPE IMP U-DRAPE 54X76 (DRAPES) ×6 IMPLANT
DRAPE SPLIT 6X30 W/TAPE (DRAPES) ×6 IMPLANT
DRILL JUGGERKNOT W/NDL BIT 2.9 (DRILL) ×3
ELECT CAUTERY BLADE TIP 2.5 (TIP) ×3
ELECT REM PT RETURN 9FT ADLT (ELECTROSURGICAL) ×3
ELECTRODE CAUTERY BLDE TIP 2.5 (TIP) ×1 IMPLANT
ELECTRODE REM PT RTRN 9FT ADLT (ELECTROSURGICAL) ×1 IMPLANT
GAUZE SPONGE 4X4 12PLY STRL (GAUZE/BANDAGES/DRESSINGS) ×3 IMPLANT
GAUZE XEROFORM 1X8 LF (GAUZE/BANDAGES/DRESSINGS) ×3 IMPLANT
GLOVE BIO SURGEON STRL SZ7.5 (GLOVE) ×6 IMPLANT
GLOVE BIO SURGEON STRL SZ8 (GLOVE) ×6 IMPLANT
GLOVE BIOGEL PI IND STRL 8 (GLOVE) ×1 IMPLANT
GLOVE BIOGEL PI INDICATOR 8 (GLOVE) ×2
GLOVE INDICATOR 8.0 STRL GRN (GLOVE) ×3 IMPLANT
GOWN STRL REUS W/ TWL LRG LVL3 (GOWN DISPOSABLE) ×1 IMPLANT
GOWN STRL REUS W/ TWL XL LVL3 (GOWN DISPOSABLE) ×1 IMPLANT
GOWN STRL REUS W/TWL LRG LVL3 (GOWN DISPOSABLE) ×2
GOWN STRL REUS W/TWL XL LVL3 (GOWN DISPOSABLE) ×2
GRASPER SUT 15 45D LOW PRO (SUTURE) IMPLANT
IV LACTATED RINGER IRRG 3000ML (IV SOLUTION) ×4
IV LR IRRIG 3000ML ARTHROMATIC (IV SOLUTION) ×2 IMPLANT
KIT CANNULA 8X76-LX IN CANNULA (CANNULA) IMPLANT
KIT SUTURE 2.8 Q-FIX DISP (MISCELLANEOUS) ×3 IMPLANT
MANIFOLD NEPTUNE II (INSTRUMENTS) ×3 IMPLANT
MASK FACE SPIDER DISP (MASK) ×3 IMPLANT
MAT ABSORB  FLUID 56X50 GRAY (MISCELLANEOUS) ×2
MAT ABSORB FLUID 56X50 GRAY (MISCELLANEOUS) ×1 IMPLANT
NEEDLE MAYO 6 CRC TAPER PT (NEEDLE) ×3 IMPLANT
PACK ARTHROSCOPY SHOULDER (MISCELLANEOUS) ×3 IMPLANT
PAD ABD DERMACEA PRESS 5X9 (GAUZE/BANDAGES/DRESSINGS) ×3 IMPLANT
PASSER SUT FIRSTPASS SELF (INSTRUMENTS) ×3 IMPLANT
SLING ARM LRG DEEP (SOFTGOODS) ×3 IMPLANT
SLING ULTRA II LG (MISCELLANEOUS) ×3 IMPLANT
STAPLER SKIN PROX 35W (STAPLE) ×3 IMPLANT
STRAP SAFETY 5IN WIDE (MISCELLANEOUS) ×3 IMPLANT
SUT ETHIBOND 0 MO6 C/R (SUTURE) ×3 IMPLANT
SUT ULTRABRAID 2 COBRAID 38 (SUTURE) IMPLANT
SUT VIC AB 2-0 CT1 27 (SUTURE) ×4
SUT VIC AB 2-0 CT1 TAPERPNT 27 (SUTURE) ×2 IMPLANT
TAPE MICROFOAM 4IN (TAPE) ×3 IMPLANT
TUBING ARTHRO INFLOW-ONLY STRL (TUBING) ×3 IMPLANT
TUBING CONNECTING 10 (TUBING) ×2 IMPLANT
TUBING CONNECTING 10' (TUBING) ×1
WAND WEREWOLF FLOW 90D (MISCELLANEOUS) ×3 IMPLANT

## 2019-08-29 NOTE — Anesthesia Procedure Notes (Signed)
Anesthesia Regional Block: Interscalene brachial plexus block   Pre-Anesthetic Checklist: ,, timeout performed, Correct Patient, Correct Site, Correct Laterality, Correct Procedure, Correct Position, site marked, Risks and benefits discussed,  Surgical consent,  Pre-op evaluation,  At surgeon's request and post-op pain management  Laterality: Left and Upper  Prep: chloraprep       Needles:  Injection technique: Single-shot  Needle Type: Stimiplex     Needle Length: 5cm  Needle Gauge: 22     Additional Needles:   Procedures:,,,, ultrasound used (permanent image in chart),,,,  Narrative:  Start time: 08/29/2019 1:00 PM End time: 08/29/2019 1:02 PM Injection made incrementally with aspirations every 5 mL.  Performed by: Personally  Anesthesiologist: Martha Clan, MD  Additional Notes: Functioning IV was confirmed and monitors were applied.  A 74mm 22ga Stimuplex needle was used. Sterile prep and drape,hand hygiene and sterile gloves were used.  Negative aspiration and negative test dose prior to incremental administration of local anesthetic. The patient tolerated the procedure well.

## 2019-08-29 NOTE — Anesthesia Procedure Notes (Signed)
Procedure Name: Intubation Date/Time: 08/29/2019 2:57 PM Performed by: Jerrye Noble, CRNA Pre-anesthesia Checklist: Patient identified, Emergency Drugs available, Suction available, Patient being monitored and Timeout performed Patient Re-evaluated:Patient Re-evaluated prior to induction Oxygen Delivery Method: Circle system utilized Preoxygenation: Pre-oxygenation with 100% oxygen Induction Type: IV induction Ventilation: Mask ventilation without difficulty Laryngoscope Size: McGraph and 3 Grade View: Grade II Tube type: Oral Number of attempts: 1 Airway Equipment and Method: Stylet and Oral airway Placement Confirmation: ETT inserted through vocal cords under direct vision,  positive ETCO2 and breath sounds checked- equal and bilateral Secured at: 21 cm Tube secured with: Tape Dental Injury: Teeth and Oropharynx as per pre-operative assessment

## 2019-08-29 NOTE — Discharge Instructions (Addendum)
AMBULATORY SURGERY  DISCHARGE INSTRUCTIONS   1) The drugs that you were given will stay in your system until tomorrow so for the next 24 hours you should not:  A) Drive an automobile B) Make any legal decisions C) Drink any alcoholic beverage   2) You may resume regular meals tomorrow.  Today it is better to start with liquids and gradually work up to solid foods.  You may eat anything you prefer, but it is better to start with liquids, then soup and crackers, and gradually work up to solid foods.   3) Please notify your doctor immediately if you have any unusual bleeding, trouble breathing, redness and pain at the surgery site, drainage, fever, or pain not relieved by medication.    4) Additional Instructions:        Please contact your physician with any problems or Same Day Surgery at [phone], Monday through Friday 6 am to 4 pm, or [place] at [facility] number at [phone].Orthopedic discharge instructions: Keep dressing dry and intact.  May shower after dressing changed on post-op day #4 (Saturday).  Cover staples with Band-Aids after drying off. Apply ice frequently to shoulder. Take diclofenac 50 mg TID OR ibuprofen 600-800 mg TID with meals for 7-10 days, then as necessary. Take oxycodone as prescribed when needed.  May supplement with ES Tylenol if necessary. Keep shoulder immobilizer on at all times except may remove for bathing purposes. Follow-up in 10-14 days or as scheduled.

## 2019-08-29 NOTE — H&P (Signed)
Paper H&P to be scanned into permanent record. H&P reviewed and patient re-examined. No changes. 

## 2019-08-29 NOTE — Transfer of Care (Signed)
Immediate Anesthesia Transfer of Care Note  Patient: Shawna Santiago  Procedure(s) Performed: SHOULDER ARTHROSCOPY WITH DEBRIDEMENT, SUBACROMIAL DECOMPRESSION AND OPEN ROTATOR CUFF REPAIR, PROBABLEOPEN BICEP TENDON REPAIR (Left Shoulder)  Patient Location: PACU  Anesthesia Type:General  Level of Consciousness: awake, alert  and oriented  Airway & Oxygen Therapy: Patient Spontanous Breathing and Patient connected to face mask oxygen  Post-op Assessment: Report given to RN and Post -op Vital signs reviewed and stable  Post vital signs: Reviewed and stable  Last Vitals:  Vitals Value Taken Time  BP 99/69 08/29/19 1556  Temp    Pulse 71 08/29/19 1558  Resp 16 08/29/19 1558  SpO2 100 % 08/29/19 1558  Vitals shown include unvalidated device data.  Last Pain:  Vitals:   08/29/19 1555  TempSrc:   PainSc: (P) Asleep         Complications: No apparent anesthesia complications

## 2019-08-29 NOTE — Anesthesia Post-op Follow-up Note (Signed)
Anesthesia QCDR form completed.        

## 2019-08-29 NOTE — Anesthesia Postprocedure Evaluation (Signed)
Anesthesia Post Note  Patient: Shawna Santiago  Procedure(s) Performed: SHOULDER ARTHROSCOPY WITH DEBRIDEMENT, SUBACROMIAL DECOMPRESSION AND OPEN ROTATOR CUFF REPAIR, PROBABLEOPEN BICEP TENDON REPAIR (Left Shoulder)  Patient location during evaluation: PACU Anesthesia Type: General Level of consciousness: awake and alert Pain management: pain level controlled Vital Signs Assessment: post-procedure vital signs reviewed and stable Respiratory status: spontaneous breathing, nonlabored ventilation, respiratory function stable and patient connected to nasal cannula oxygen Cardiovascular status: blood pressure returned to baseline and stable Postop Assessment: no apparent nausea or vomiting Anesthetic complications: no     Last Vitals:  Vitals:   08/29/19 1643 08/29/19 1700  BP: 109/67 110/66  Pulse: 70 69  Resp: 18 17  Temp: (!) 36.4 C 36.4 C  SpO2: 100% 100%    Last Pain:  Vitals:   08/29/19 1700  TempSrc:   PainSc: 0-No pain                 Jenah Vanasten S

## 2019-08-29 NOTE — Consult Note (Signed)
Pharmacy Antibiotic Note  Shawna Santiago is a 53 y.o. female admitted on 08/29/19 for planned surgical procedure.  Pharmacy has been consulted for Cefazolin pre-op dosing dosing.  Plan: Cefazolin 2g IV 30-min pre-op has been ordered.   No data recorded.  No results for input(s): WBC, CREATININE, LATICACIDVEN, VANCOTROUGH, VANCOPEAK, VANCORANDOM, GENTTROUGH, GENTPEAK, GENTRANDOM, TOBRATROUGH, TOBRAPEAK, TOBRARND, AMIKACINPEAK, AMIKACINTROU, AMIKACIN in the last 168 hours.  CrCl cannot be calculated (Patient's most recent lab result is older than the maximum 21 days allowed.).    Allergies  Allergen Reactions  . Lyrica [Pregabalin] Other (See Comments)    halucinations  . Stadol [Butorphanol] Hives    Thank you for allowing pharmacy to be a part of this patient's care.  Pernell Dupre, PharmD, BCPS Clinical Pharmacist 08/29/2019 10:53 AM

## 2019-08-29 NOTE — Op Note (Signed)
08/29/2019  4:27 PM  Patient:   Shawna Santiago  Pre-Op Diagnosis:   Impingement/tendinopathy with full-thickness rotator cuff tear, left shoulder.  Post-Op Diagnosis:   Impingement/tendinopathy with full-thickness rotator cuff tear degenerative labral fraying, and biceps tendinopathy, left shoulder.  Procedure:   Limited arthroscopic debridement, arthroscopic subacromial decompression, mini-open rotator cuff repair, and mini-open biceps tenodesis, left shoulder.  Anesthesia:   General endotracheal with interscalene block using Exparel placed preoperatively by the anesthesiologist.  Surgeon:   Pascal Lux, MD  Assistant:   Larrie Kass, PA-S  Findings:   As above.  There was moderate fraying of the superior and anterosuperior portions of the labrum without frank detachment from the glenoid.  There was a full-thickness tear involving the anterior and middle portions of the supraspinatus insertional fibers.  The remainder of the rotator cuff was in satisfactory condition.  The biceps tendon demonstrated signs of "scuffing", but otherwise was intact.  There were grade 1 chondromalacial changes involving the central portion of the glenoid.  The humeral articular surface was in satisfactory condition.  Complications:   None  Fluids:   700 cc  Estimated blood loss:   10 cc  Tourniquet time:   None  Drains:   None  Closure:   Staples      Brief clinical note:   The patient is a 53 year old female with a history of left shoulder pain. The patient's symptoms have progressed despite medications, activity modification, etc. The patient's history and examination are consistent with impingement/tendinopathy with a rotator cuff tear. These findings were confirmed by MRI scan. The patient presents at this time for definitive management of these shoulder symptoms.  Procedure:   The patient underwent placement of an interscalene block using Exparel by the anesthesiologist in the preoperative  holding area before being brought into the operating room and lain in the supine position. The patient then underwent general endotracheal intubation and anesthesia before being repositioned in the beach chair position using the beach chair positioner. The left shoulder and upper extremity were prepped with ChloraPrep solution before being draped sterilely. Preoperative antibiotics were administered. A timeout was performed to confirm the proper surgical site before the expected portal sites and incision site were injected with 0.5% Sensorcaine with epinephrine. A posterior portal was created and the glenohumeral joint thoroughly inspected with the findings as described above. An anterior portal was created using an outside-in technique. The labrum and rotator cuff were further probed, again confirming the above-noted findings. The areas of degenerative labral fraying were debrided back to stable margins using the full-radius resector, as were the torn margins of the rotator cuff tear. The ArthroCare wand was inserted and used to release the biceps tendon from its labral anchor. It also was used to obtain hemostasis as well as to "anneal" the labrum superiorly and anteriorly. The instruments were removed from the joint after suctioning the excess fluid.  The camera was repositioned through the posterior portal into the subacromial space. A separate lateral portal was created using an outside-in technique. The 3.5 mm full-radius resector was introduced and used to perform a subtotal bursectomy. The ArthroCare wand was then inserted and used to remove the periosteal tissue off the undersurface of the anterior third of the acromion as well as to recess the coracoacromial ligament from its attachment along the anterior and lateral margins of the acromion. The 4.0 mm acromionizing bur was introduced and used to complete the decompression by removing the undersurface of the anterior third of the  acromion. The full  radius resector was reintroduced to remove any residual bony debris before the ArthroCare wand was reintroduced to obtain hemostasis. The instruments were then removed from the subacromial space after suctioning the excess fluid.  An approximately 4-5 cm incision was made over the anterolateral aspect of the shoulder beginning at the anterolateral corner of the acromion and extending distally in line with the bicipital groove. This incision was carried down through the subcutaneous tissues to expose the deltoid fascia. The raphae between the anterior and middle thirds was identified and this plane developed to provide access into the subacromial space. Additional bursal tissues were debrided sharply using Metzenbaum scissors. The rotator cuff tear was readily identified. The margins were debrided sharply with a #15 blade and the exposed greater tuberosity roughened with a rongeur. The tear was repaired using two [employer] 2.8 mm Q-Fix anchors. These sutures were then brought back laterally and secured using two [place] knotless Regenasorb anchors to create a two-layer closure. An apparent watertight closure was obtained.  The bicipital groove was identified by palpation and opened for 1-1.5 cm. The biceps tendon stump was retrieved through this defect. The floor of the bicipital groove was roughened with a curet before a single Biomet 2.9 mm JuggerKnot anchor was inserted. Both sets of sutures were passed through the biceps tendon and tied securely to effect the tenodesis. The bicipital sheath was reapproximated using two #0 Ethibond interrupted sutures, incorporating the biceps tendon to further reinforce the tenodesis.  The wound was copiously irrigated with sterile saline solution before the deltoid raphae was reapproximated using 2-0 Vicryl interrupted sutures. The subcutaneous tissues were closed in two layers using 2-0 Vicryl interrupted sutures before the skin was closed using  staples. The portal sites also were closed using staples. A sterile bulky dressing was applied to the shoulder before the arm was placed into a shoulder immobilizer. The patient was then awakened, extubated, and returned to the recovery room in satisfactory condition after tolerating the procedure well.

## 2019-08-29 NOTE — Anesthesia Preprocedure Evaluation (Signed)
Anesthesia Evaluation  Patient identified by MRN, date of birth, ID band Patient awake    Reviewed: Allergy & Precautions, H&P , NPO status , Patient's Chart, lab work & pertinent test results  History of Anesthesia Complications Negative for: history of anesthetic complications  Airway Mallampati: III  TM Distance: >3 FB Neck ROM: full    Dental no notable dental hx. (+) Teeth Intact   Pulmonary neg pulmonary ROS, neg shortness of breath,    Pulmonary exam normal        Cardiovascular Exercise Tolerance: Good (-) angina(-) Past MI and (-) DOE negative cardio ROS Normal cardiovascular exam     Neuro/Psych  Headaches, neg Seizures PSYCHIATRIC DISORDERS Anxiety Bipolar Disorder    GI/Hepatic negative GI ROS, Neg liver ROS, GERD  Controlled,  Endo/Other  negative endocrine ROS  Renal/GU Renal disease (kidney stones)  negative genitourinary   Musculoskeletal   Abdominal   Peds  Hematology negative hematology ROS (+)   Anesthesia Other Findings Past Medical History:   Cancer ([place])                                                   Comment:kidney cancer   Migraine                                                     Kidney stone                                                 Allergy                                                      Bronchitis                                                   Bipolar 1 disorder, mixed ([place])                 2001           Comment:11/10: Pt reports she is working with               neuropsychiatrist/ psychotherapist for meds               managment and regular therapy.    ADHD (attention deficit hyperactivity disorder)              Anxiety                                                      GERD (gastroesophageal reflux disease)  Arthritis                                                    PTSD (post-traumatic stress disorder)                       Hyperlipidemia                                              Past Surgical History:   LITHOTRIPSY                                                   COLONOSCOPY WITH PROPOFOL                       N/A 07/05/2015      Comment:Procedure: COLONOSCOPY WITH PROPOFOL;  Surgeon:              Manya Silvas, MD;  Location[facility]               ENDOSCOPY;  Service: Endoscopy;  Laterality:               N/A;   ESOPHAGOGASTRODUODENOSCOPY                       07/05/2015      Comment:Procedure: ESOPHAGOGASTRODUODENOSCOPY (EGD);                Surgeon: Manya Silvas, MD;  Location: [facility]               ENDOSCOPY;  Service: Endoscopy;;   ABDOMINAL HYSTERECTOMY                           2000           Comment:R ovary remains (Laproscopic procedure)    [place]                                  2000         removal of R kidney                              1999           Comment:due to cancer  BMI    Body Mass Index   29.49 kg/m 2      Reproductive/Obstetrics negative OB ROS                             Anesthesia Physical  Anesthesia Plan  ASA: II  Anesthesia Plan: General   Post-op Pain Management:  Regional for Post-op pain   Induction: Intravenous  PONV Risk Score and Plan: 3 and Ondansetron, Dexamethasone, Midazolam, Treatment may vary due to age or medical condition and Promethazine  Airway Management Planned: Oral ETT  Additional Equipment:   Intra-op Plan:   Post-operative Plan: Extubation in OR  Informed Consent: I have reviewed the patients History and Physical, chart, labs and discussed the procedure including the risks, benefits and alternatives for the proposed anesthesia with the patient or authorized representative who has indicated his/her understanding and acceptance.     Dental Advisory Given  Plan Discussed with: Anesthesiologist, CRNA and Surgeon  Anesthesia Plan  Comments:         Anesthesia Quick Evaluation

## 2019-08-30 ENCOUNTER — Encounter: Payer: Self-pay | Admitting: Surgery

## 2019-10-30 ENCOUNTER — Ambulatory Visit: Payer: Self-pay | Admitting: Urology

## 2019-11-13 ENCOUNTER — Ambulatory Visit
Admission: RE | Admit: 2019-11-13 | Discharge: 2019-11-13 | Disposition: A | Discharge status: Home / Self Care | Payer: BC Managed Care – PPO | Source: Ambulatory Visit | Attending: Urology | Admitting: Urology

## 2019-11-13 ENCOUNTER — Ambulatory Visit (INDEPENDENT_AMBULATORY_CARE_PROVIDER_SITE_OTHER): Payer: BC Managed Care – PPO | Admitting: Urology

## 2019-11-13 ENCOUNTER — Encounter: Payer: Self-pay | Admitting: Urology

## 2019-11-13 ENCOUNTER — Other Ambulatory Visit: Payer: Self-pay

## 2019-11-13 VITALS — BP 107/71 | HR 83 | Ht 61.0 in | Wt 180.0 lb

## 2019-11-13 DIAGNOSIS — N200 Calculus of kidney: Secondary | ICD-10-CM

## 2019-11-13 DIAGNOSIS — Z85528 Personal history of other malignant neoplasm of kidney: Secondary | ICD-10-CM

## 2019-11-13 NOTE — Progress Notes (Signed)
11/13/2019 3:01 PM   Shawna Santiago February 01, 1966 [id number]  Referring provider: Idelle Crouch, MD [facility] [place],  Shawna Santiago [id number]  Chief Complaint  Patient presents with  . Follow-up    Kidney stones    Urologic history: 1.  Recurrent stone disease -Left ureteroscopic stone removal with Dr. Elnoria Howard 2014 -CT 1/20 28mm left lower pole calculus  2. History renal cell carcinoma -Right radical nephrectomy Dr. Madelin Headings 1999   HPI: 54 y.o. female presents for annual follow-up.  She states she has done quite well the last year and denies flank, abdominal or pelvic pain.  She has no bothersome lower urinary tract symptoms or gross hematuria.  KUB performed today was reviewed and there is a 2 mm calcification overlying the lower portion of the left renal outline.   PMH: Past Medical History:  Diagnosis Date  . ADHD (attention deficit hyperactivity disorder)   . Allergy   . Anxiety   . Arthritis   . Bipolar 1 disorder, mixed ([place]) 2001   11/10: Pt reports she is working with neuropsychiatrist/ psychotherapist for meds managment and regular therapy.   . Bronchitis   . Cancer [facility]) 1999   kidney cancer  . Depression   . GERD (gastroesophageal reflux disease)   . History of kidney stones   . Hyperlipidemia   . Migraine   . PTSD (post-traumatic stress disorder)     Surgical History: Past Surgical History:  Procedure Laterality Date  . ABDOMINAL HYSTERECTOMY  2000   R ovary remains (Laproscopic procedure)   . CHOLECYSTECTOMY  2000  . COLONOSCOPY WITH PROPOFOL N/A 07/05/2015   Procedure: COLONOSCOPY WITH PROPOFOL;  Surgeon: Manya Silvas, MD;  Location[facility] ENDOSCOPY;  Service: Endoscopy;  Laterality: N/A;  . ESOPHAGOGASTRODUODENOSCOPY  07/05/2015   Procedure: ESOPHAGOGASTRODUODENOSCOPY (EGD);  Surgeon: Manya Silvas, MD;  Location: [place];  Service: Endoscopy;;  . LAPAROSCOPIC OOPHERECTOMY Right 09/09/2015   Procedure:  LAPAROSCOPIC OOPHERECTOMY, left peritinel cyst wall removed;  Surgeon: Shawna Kindler, MD;  Location: [facility] ORS;  Service: Gynecology;  Laterality: Right;  . LITHOTRIPSY    . removal of R kidney   1999   due to cancer  . SHOULDER ARTHROSCOPY WITH SUBACROMIAL DECOMPRESSION AND OPEN ROTATOR C Left 08/29/2019   Procedure: SHOULDER ARTHROSCOPY WITH DEBRIDEMENT, SUBACROMIAL DECOMPRESSION AND OPEN ROTATOR CUFF REPAIR, PROBABLEOPEN BICEP TENDON REPAIR;  Surgeon: Corky Mull, MD;  Location: [facility] ORS;  Service: Orthopedics;  Laterality: Left;  . TUBAL LIGATION  1998    Home Medications:  Allergies as of 11/13/2019      Reactions   Lyrica [pregabalin] Other (See Comments)   halucinations   Stadol [butorphanol] Hives      Medication List       Accurate as of November 13, 2019  3:01 PM. If you have any questions, ask your nurse or doctor.        ALPRAZolam 0.25 MG tablet Commonly known as: XANAX Take 0.25-0.5 mg by mouth See admin instructions. Take 0.25 mg by mouth in the morning and 0.5 mg in the evening   amitriptyline 25 MG tablet Commonly known as: ELAVIL Take 25 mg by mouth every evening.   amphetamine-dextroamphetamine 20 MG tablet Commonly known as: ADDERALL Take 30 mg by mouth daily in the afternoon.   Aplenzin 522 MG Tb24 Generic drug: BuPROPion HBr Take 522 mg by mouth every morning.   B-12 5000 MCG Caps Take 5,000 mcg by mouth every morning.   cetirizine  10 MG tablet Commonly known as: ZYRTEC Take 10 mg by mouth every morning.   diclofenac 50 MG tablet Commonly known as: CATAFLAM Take 50 mg by mouth 3 (three) times daily as needed (pain).   dicyclomine 10 MG capsule Commonly known as: BENTYL Take 1 capsule (10 mg total) by mouth 4 (four) times daily -  before meals and at bedtime. What changed: when to take this   estradiol 2 MG tablet Commonly known as: ESTRACE Take 2 mg by mouth every morning.   lisdexamfetamine 70 MG capsule Commonly known as:  VYVANSE Take 70 mg by mouth daily.   naltrexone 50 MG tablet Commonly known as: DEPADE Take 50 mg by mouth every evening.   omeprazole 40 MG capsule Commonly known as: PRILOSEC Take 40 mg by mouth every morning.   oxyCODONE 5 MG immediate release tablet Commonly known as: Roxicodone Take 1-2 tablets (5-10 mg total) by mouth every 4 (four) hours as needed for moderate pain or severe pain.   propranolol ER 60 MG 24 hr capsule Commonly known as: INDERAL LA Take 60 mg by mouth every evening.   rosuvastatin 20 MG tablet Commonly known as: CRESTOR Take 20 mg by mouth every morning.   tiZANidine 2 MG tablet Commonly known as: ZANAFLEX Take 2-4 mg by mouth See admin instructions. Take 2 mg by mouth in the afternoon and 4 mg in the evening   topiramate 50 MG tablet Commonly known as: TOPAMAX Take 50 mg by mouth 3 (three) times daily.   Trintellix 20 MG Tabs tablet Generic drug: vortioxetine HBr Take 20 mg by mouth daily.   Vraylar 4.5 MG Caps Generic drug: Cariprazine HCl Take 4.5 mg by mouth every evening.       Allergies:  Allergies  Allergen Reactions  . Lyrica [Pregabalin] Other (See Comments)    halucinations  . Stadol [Butorphanol] Hives    Family History: Family History  Problem Relation Age of Onset  . Diabetes Mother   . Cancer Father   . Bipolar disorder Father   . Depression Father   . Breast cancer Paternal Grandmother     Social History:  reports that she has never smoked. She has never used smokeless tobacco. She reports that she does not drink alcohol or use drugs.  ROS: UROLOGY Frequent Urination?: No Hard to postpone urination?: No Burning/pain with urination?: No Get up at night to urinate?: No Leakage of urine?: No Urine stream starts and stops?: No Trouble starting stream?: No Do you have to strain to urinate?: No Blood in urine?: No Urinary tract infection?: No Sexually transmitted disease?: No Injury to kidneys or bladder?:  No Painful intercourse?: No Weak stream?: No Currently pregnant?: No Vaginal bleeding?: No Last menstrual period?: n  Gastrointestinal Nausea?: No Vomiting?: No Indigestion/heartburn?: No Diarrhea?: No Constipation?: No  Constitutional Fever: No Night sweats?: No Weight loss?: No Fatigue?: No  Skin Skin rash/lesions?: No Itching?: No  Eyes Blurred vision?: No Double vision?: No  Ears/Nose/Throat Sore throat?: No Sinus problems?: No  Hematologic/Lymphatic Swollen glands?: No Easy bruising?: No  Cardiovascular Leg swelling?: No Chest pain?: No  Respiratory Cough?: No Shortness of breath?: No  Endocrine Excessive thirst?: No  Musculoskeletal Back pain?: No Joint pain?: No  Neurological Headaches?: No Dizziness?: No  Psychologic Depression?: No Anxiety?: No  Physical Exam: BP 107/71   Pulse 83   Ht 5\' 1"  (1.549 m)   Wt 180 lb (81.6 kg)   BMI 34.01 kg/m   Constitutional:  Alert  and oriented, No acute distress. HEENT: [place] AT, moist mucus membranes.  Trachea midline, no masses. Cardiovascular: No clubbing, cyanosis, or edema. Respiratory: Normal respiratory effort, no increased work of breathing. Neurologic: Grossly intact, no focal deficits, moving all 4 extremities. Psychiatric: Normal mood and affect.   Assessment & Plan:    - Nephrolithiasis Stable, nonobstructing 2 mm left lower pole calculus.  Follow-up 1 year with [place], MD  [place] [address], [place], [place] [address] [phone]

## 2020-04-15 ENCOUNTER — Other Ambulatory Visit: Payer: Self-pay | Admitting: Internal Medicine

## 2020-04-15 DIAGNOSIS — Z1231 Encounter for screening mammogram for malignant neoplasm of breast: Secondary | ICD-10-CM

## 2020-04-15 DIAGNOSIS — R55 Syncope and collapse: Secondary | ICD-10-CM

## 2020-04-26 ENCOUNTER — Ambulatory Visit
Admission: RE | Admit: 2020-04-26 | Discharge: 2020-04-26 | Disposition: A | Discharge status: Home / Self Care | Payer: BC Managed Care – PPO | Source: Ambulatory Visit | Attending: Internal Medicine | Admitting: Internal Medicine

## 2020-04-26 ENCOUNTER — Other Ambulatory Visit: Payer: Self-pay

## 2020-04-26 DIAGNOSIS — R55 Syncope and collapse: Secondary | ICD-10-CM | POA: Diagnosis not present

## 2020-06-21 IMAGING — CR DG ABDOMEN 1V
1 series · 2 of 2 positions shown · non-contrast
Comparison: Radiograph 09/13/2013, CT renal colic 10/31/2018

CLINICAL DATA: Nephrolithiasis, renal cancer 20 years prior, right
kidney removal, history of urolithiasis

EXAM:
ABDOMEN - 1 VIEW

[Series 1: dg abd 1 view · 0.14mm/px · 2 of 2 slices shown]
[im 1/2]
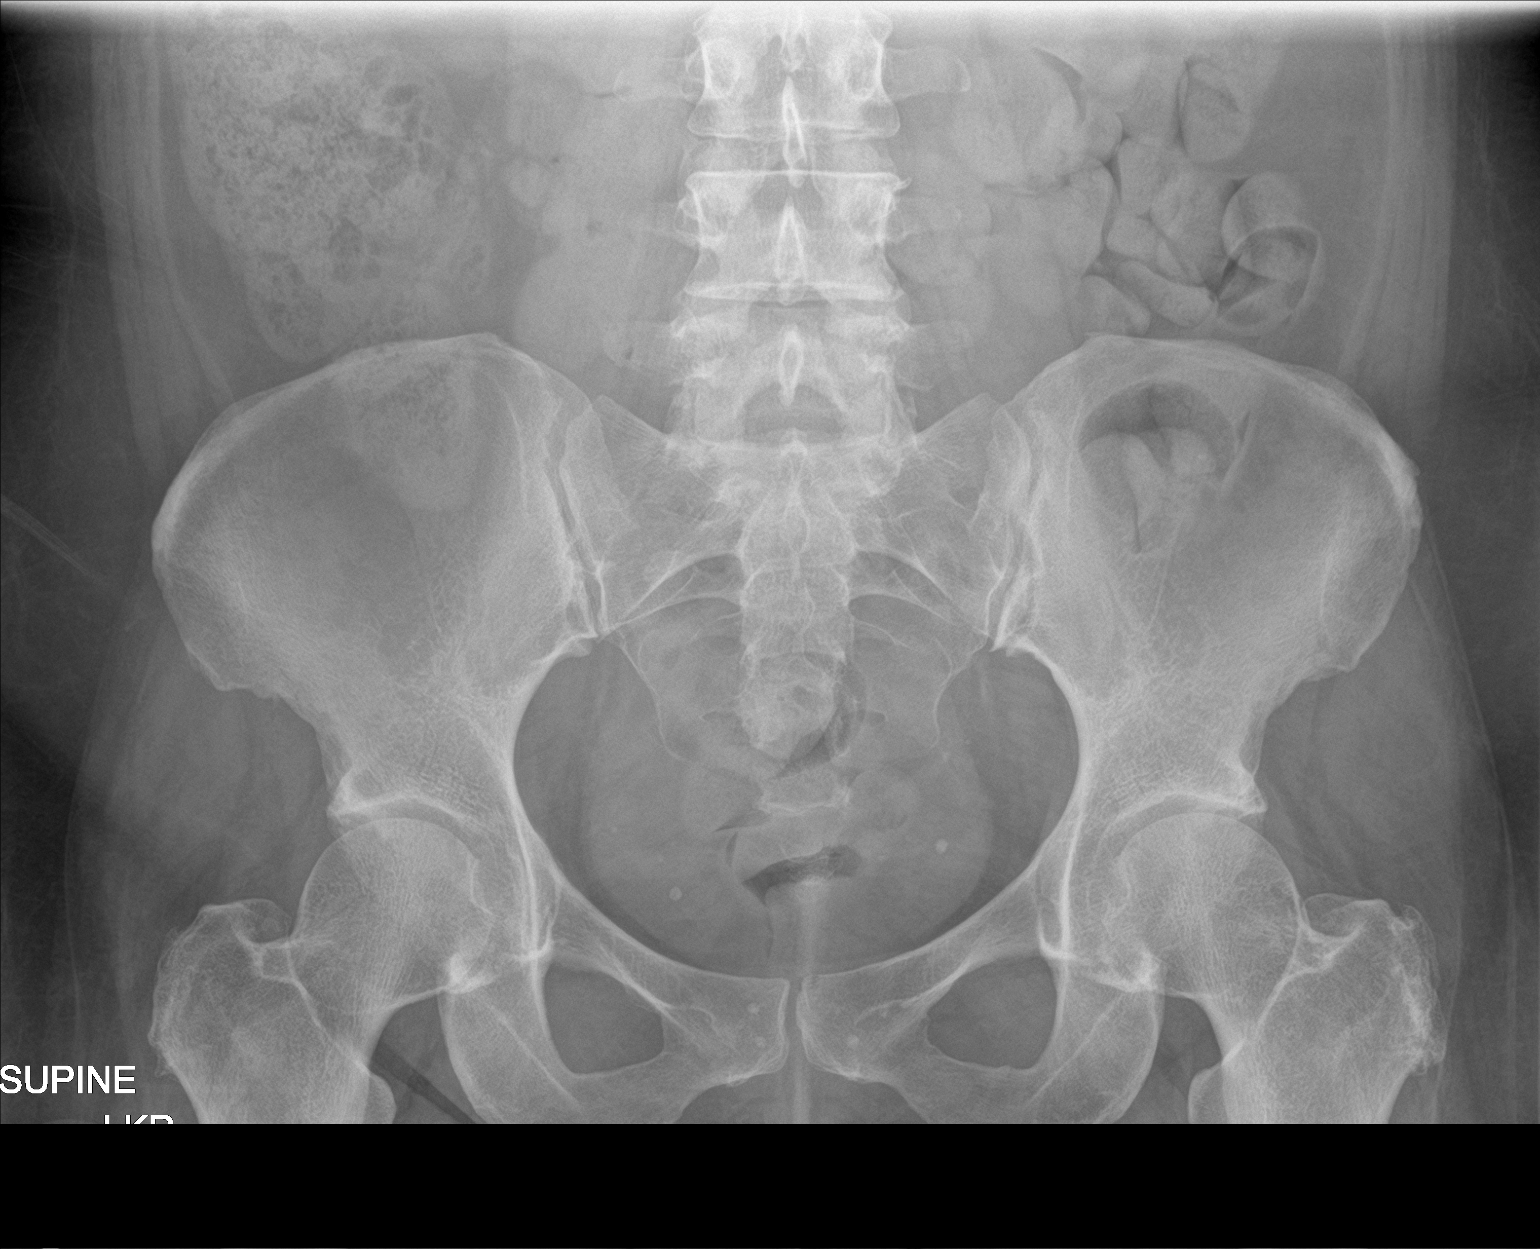
[im 2/2]
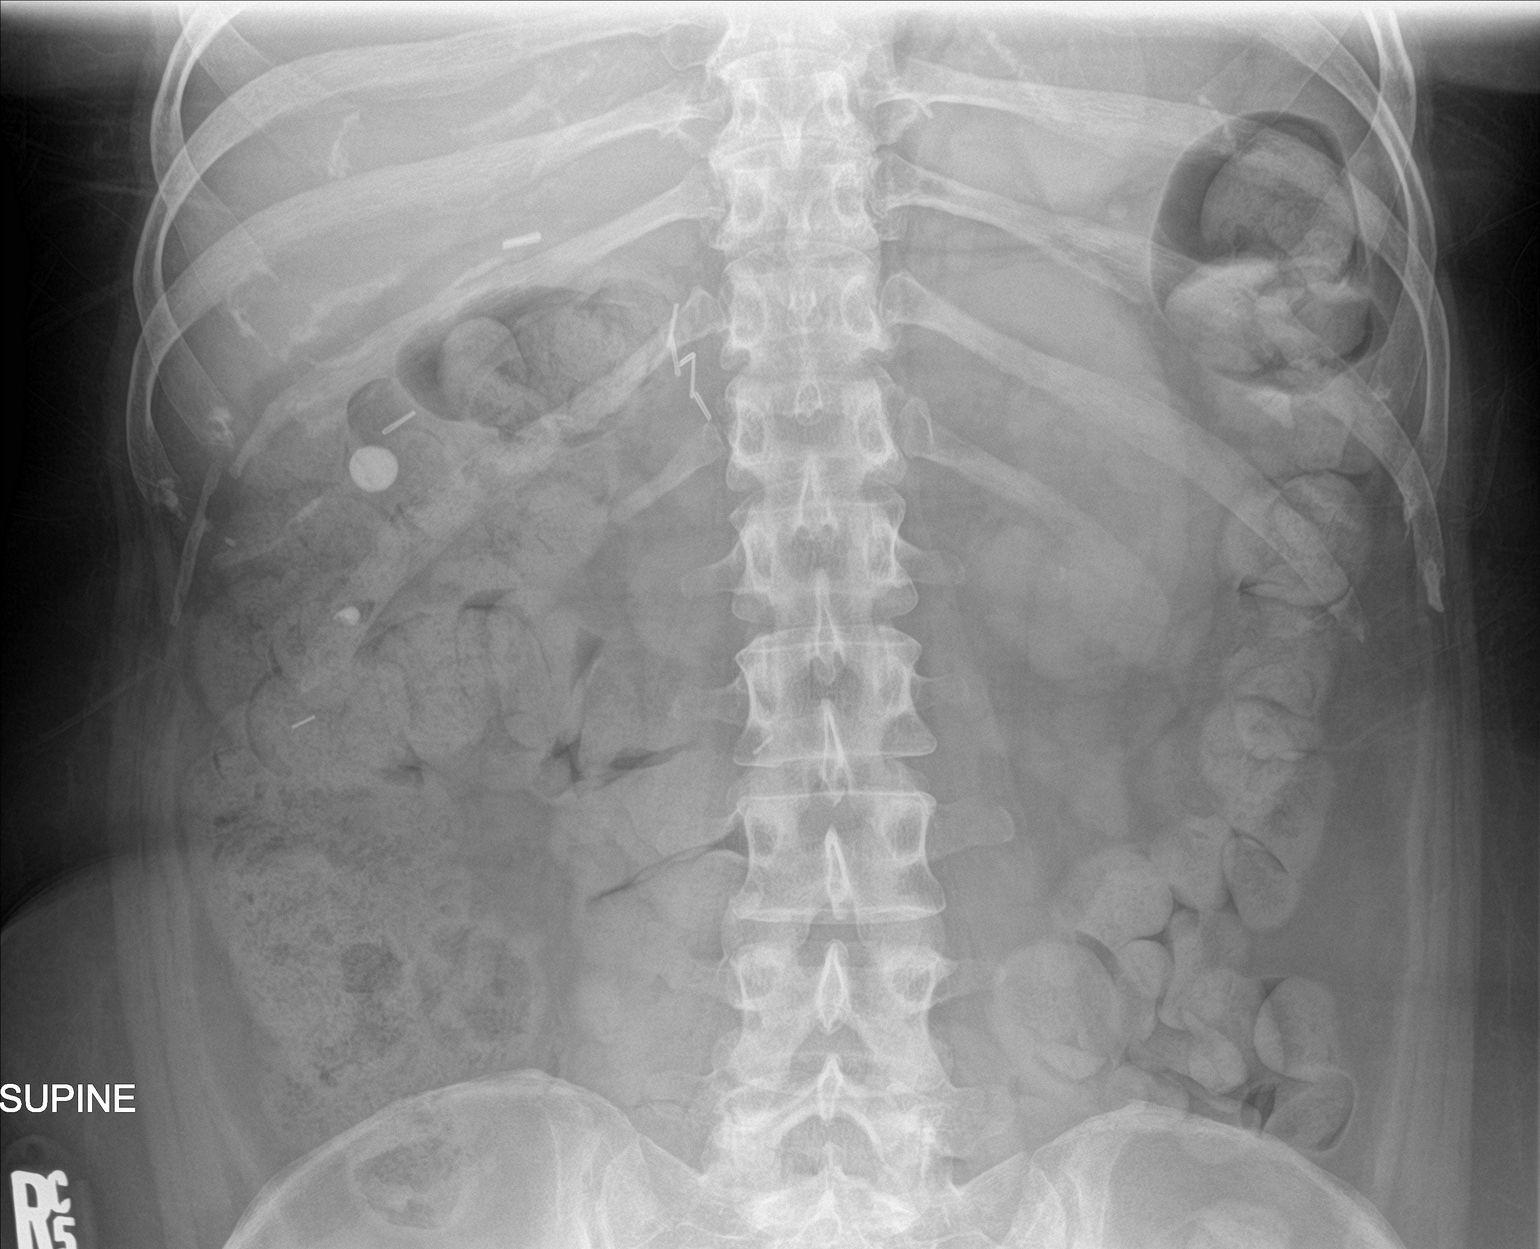

[2 of 2 positions shown; findings below may reference images not displayed]

FINDINGS: Postsurgical changes in the right renal fossa from prior
nephrectomy. Coarse calcification projects over the left lower renal
shadow. No visible calcifications along the expected course of the
left ureter. Angular mineralization along the posterolateral bladder
shadow on the left could reflect a distal ureteral calculus or
additional phlebolith given the presence of multiple vascular
calcifications seen on comparison CT. Rounded radiodensity in the
right upper quadrant likely ingested material such as
medication/supplement. No high-grade obstructive bowel gas pattern.
Minimal degenerative changes in the hips and SI joints as well as
the imaged portions of the lumbar spine. Osseous structures are
otherwise unremarkable.
IMPRESSION: 1. Coarse calcification projects over the left lower renal shadow.
2. Angular mineralization along the posterolateral bladder shadow on
the left could reflect a distal ureteral calculus or phlebolith.
3. Postsurgical changes from prior right nephrectomy.

## 2020-07-18 ENCOUNTER — Other Ambulatory Visit: Payer: Self-pay | Admitting: Internal Medicine

## 2020-07-18 ENCOUNTER — Other Ambulatory Visit (HOSPITAL_COMMUNITY): Payer: Self-pay | Admitting: Internal Medicine

## 2020-07-18 DIAGNOSIS — R55 Syncope and collapse: Secondary | ICD-10-CM

## 2020-08-01 ENCOUNTER — Other Ambulatory Visit: Payer: Self-pay

## 2020-08-01 ENCOUNTER — Ambulatory Visit
Admission: RE | Admit: 2020-08-01 | Discharge: 2020-08-01 | Disposition: A | Discharge status: Home / Self Care | Payer: BC Managed Care – PPO | Source: Ambulatory Visit | Attending: Internal Medicine | Admitting: Internal Medicine

## 2020-08-01 DIAGNOSIS — R55 Syncope and collapse: Secondary | ICD-10-CM

## 2020-11-13 ENCOUNTER — Ambulatory Visit: Payer: Self-pay | Admitting: Urology

## 2021-01-13 ENCOUNTER — Other Ambulatory Visit: Payer: Self-pay

## 2021-01-13 ENCOUNTER — Ambulatory Visit
Admission: RE | Admit: 2021-01-13 | Discharge: 2021-01-13 | Disposition: A | Discharge status: Home / Self Care | Payer: BC Managed Care – PPO | Source: Ambulatory Visit | Attending: Internal Medicine | Admitting: Internal Medicine

## 2021-01-13 DIAGNOSIS — Z1231 Encounter for screening mammogram for malignant neoplasm of breast: Secondary | ICD-10-CM | POA: Diagnosis present

## 2021-10-30 ENCOUNTER — Other Ambulatory Visit: Payer: Self-pay | Admitting: Internal Medicine

## 2021-10-30 DIAGNOSIS — Z1231 Encounter for screening mammogram for malignant neoplasm of breast: Secondary | ICD-10-CM

## 2022-06-16 ENCOUNTER — Ambulatory Visit
Admission: RE | Admit: 2022-06-16 | Discharge: 2022-06-16 | Disposition: A | Discharge status: Home / Self Care | Payer: BC Managed Care – PPO | Source: Ambulatory Visit | Attending: Internal Medicine | Admitting: Internal Medicine

## 2022-06-16 DIAGNOSIS — Z1231 Encounter for screening mammogram for malignant neoplasm of breast: Secondary | ICD-10-CM | POA: Diagnosis present

## 2023-06-30 ENCOUNTER — Other Ambulatory Visit: Payer: Self-pay | Admitting: Internal Medicine

## 2023-06-30 DIAGNOSIS — Z1231 Encounter for screening mammogram for malignant neoplasm of breast: Secondary | ICD-10-CM

## 2023-07-01 ENCOUNTER — Other Ambulatory Visit: Payer: Self-pay | Admitting: Internal Medicine

## 2023-07-01 DIAGNOSIS — R1310 Dysphagia, unspecified: Secondary | ICD-10-CM

## 2023-07-01 DIAGNOSIS — I10 Essential (primary) hypertension: Secondary | ICD-10-CM

## 2023-07-06 ENCOUNTER — Other Ambulatory Visit: Payer: Self-pay | Admitting: Internal Medicine

## 2023-07-06 ENCOUNTER — Ambulatory Visit
Admission: RE | Admit: 2023-07-06 | Discharge: 2023-07-06 | Disposition: A | Discharge status: Home / Self Care | Payer: BC Managed Care – PPO | Source: Ambulatory Visit | Attending: Internal Medicine | Admitting: Internal Medicine

## 2023-07-06 DIAGNOSIS — R1310 Dysphagia, unspecified: Secondary | ICD-10-CM | POA: Diagnosis present

## 2023-07-06 DIAGNOSIS — I10 Essential (primary) hypertension: Secondary | ICD-10-CM | POA: Insufficient documentation

## 2023-09-24 ENCOUNTER — Ambulatory Visit
Admission: RE | Admit: 2023-09-24 | Discharge: 2023-09-24 | Disposition: A | Discharge status: Home / Self Care | Payer: Medicare Other | Source: Ambulatory Visit | Attending: Internal Medicine | Admitting: Internal Medicine

## 2023-09-24 DIAGNOSIS — Z1231 Encounter for screening mammogram for malignant neoplasm of breast: Secondary | ICD-10-CM | POA: Diagnosis present

## 2023-10-01 ENCOUNTER — Encounter: Payer: Self-pay | Admitting: Gastroenterology

## 2023-10-05 ENCOUNTER — Encounter: Payer: Self-pay | Admitting: Gastroenterology

## 2023-10-22 ENCOUNTER — Encounter
Admission: RE | Disposition: A | Discharge status: Home / Self Care | Payer: Self-pay | Source: Home / Self Care | Attending: Gastroenterology

## 2023-10-22 ENCOUNTER — Other Ambulatory Visit: Payer: Self-pay

## 2023-10-22 ENCOUNTER — Ambulatory Visit: Payer: BC Managed Care – PPO | Admitting: Anesthesiology

## 2023-10-22 ENCOUNTER — Encounter: Payer: Self-pay | Admitting: Gastroenterology

## 2023-10-22 ENCOUNTER — Ambulatory Visit
Admission: RE | Admit: 2023-10-22 | Discharge: 2023-10-22 | Disposition: A | Discharge status: Home / Self Care | Payer: BC Managed Care – PPO | Attending: Gastroenterology | Admitting: Gastroenterology

## 2023-10-22 DIAGNOSIS — K2289 Other specified disease of esophagus: Secondary | ICD-10-CM | POA: Diagnosis not present

## 2023-10-22 DIAGNOSIS — Z90710 Acquired absence of both cervix and uterus: Secondary | ICD-10-CM | POA: Insufficient documentation

## 2023-10-22 DIAGNOSIS — F319 Bipolar disorder, unspecified: Secondary | ICD-10-CM | POA: Insufficient documentation

## 2023-10-22 DIAGNOSIS — G4730 Sleep apnea, unspecified: Secondary | ICD-10-CM | POA: Diagnosis not present

## 2023-10-22 DIAGNOSIS — K5909 Other constipation: Secondary | ICD-10-CM | POA: Insufficient documentation

## 2023-10-22 DIAGNOSIS — R1310 Dysphagia, unspecified: Secondary | ICD-10-CM | POA: Diagnosis not present

## 2023-10-22 DIAGNOSIS — Z1211 Encounter for screening for malignant neoplasm of colon: Secondary | ICD-10-CM | POA: Insufficient documentation

## 2023-10-22 DIAGNOSIS — Q438 Other specified congenital malformations of intestine: Secondary | ICD-10-CM | POA: Insufficient documentation

## 2023-10-22 DIAGNOSIS — F419 Anxiety disorder, unspecified: Secondary | ICD-10-CM | POA: Diagnosis not present

## 2023-10-22 DIAGNOSIS — Z83719 Family history of colon polyps, unspecified: Secondary | ICD-10-CM | POA: Insufficient documentation

## 2023-10-22 DIAGNOSIS — Z79899 Other long term (current) drug therapy: Secondary | ICD-10-CM | POA: Diagnosis not present

## 2023-10-22 DIAGNOSIS — Z9049 Acquired absence of other specified parts of digestive tract: Secondary | ICD-10-CM | POA: Diagnosis not present

## 2023-10-22 HISTORY — DX: Mixed irritable bowel syndrome: K58.2

## 2023-10-22 HISTORY — DX: Essential (primary) hypertension: I10

## 2023-10-22 HISTORY — DX: Sleep apnea, unspecified: G47.30

## 2023-10-22 HISTORY — PX: ESOPHAGEAL DILATION: SHX303

## 2023-10-22 HISTORY — PX: BIOPSY: SHX5522

## 2023-10-22 HISTORY — DX: Acquired absence of kidney: Z90.5

## 2023-10-22 HISTORY — DX: Carpal tunnel syndrome, right upper limb: G56.01

## 2023-10-22 HISTORY — DX: Complete rotator cuff tear or rupture of left shoulder, not specified as traumatic: M75.122

## 2023-10-22 HISTORY — PX: ESOPHAGOGASTRODUODENOSCOPY (EGD) WITH PROPOFOL: SHX5813

## 2023-10-22 HISTORY — DX: Other intervertebral disc degeneration, lumbar region without mention of lumbar back pain or lower extremity pain: M51.369

## 2023-10-22 HISTORY — PX: COLONOSCOPY WITH PROPOFOL: SHX5780

## 2023-10-22 SURGERY — COLONOSCOPY WITH PROPOFOL
Anesthesia: General

## 2023-10-22 MED ORDER — MIDAZOLAM HCL 2 MG/2ML IJ SOLN
INTRAMUSCULAR | Status: DC | PRN
  Administered 2023-10-22: 2 mg via INTRAVENOUS

## 2023-10-22 MED ORDER — LIDOCAINE HCL (CARDIAC) PF 100 MG/5ML IV SOSY
PREFILLED_SYRINGE | INTRAVENOUS | Status: DC | PRN
  Administered 2023-10-22: 100 mg via INTRAVENOUS

## 2023-10-22 MED ORDER — GLYCOPYRROLATE 0.2 MG/ML IJ SOLN
INTRAMUSCULAR | Status: DC | PRN
  Administered 2023-10-22: .2 mg via INTRAVENOUS

## 2023-10-22 MED ORDER — PROPOFOL 500 MG/50ML IV EMUL
INTRAVENOUS | Status: DC | PRN
  Administered 2023-10-22: 155 ug/kg/min via INTRAVENOUS

## 2023-10-22 MED ORDER — PROPOFOL 10 MG/ML IV BOLUS
INTRAVENOUS | Status: DC | PRN
  Administered 2023-10-22 (×2): 40 mg via INTRAVENOUS
  Administered 2023-10-22 (×2): 20 mg via INTRAVENOUS
  Administered 2023-10-22: 30 mg via INTRAVENOUS
  Administered 2023-10-22: 20 mg via INTRAVENOUS
  Administered 2023-10-22: 60 mg via INTRAVENOUS

## 2023-10-22 MED ORDER — SODIUM CHLORIDE 0.9 % IV SOLN: INTRAVENOUS | Status: DC

## 2023-10-22 MED ORDER — MIDAZOLAM HCL 2 MG/2ML IJ SOLN
INTRAMUSCULAR | Status: AC
  Filled 2023-10-22: qty 2

## 2023-10-22 NOTE — Interval H&P Note (Signed)
History and Physical Interval Note: Preprocedure H&P from 10/22/23  was reviewed and there was no interval change after seeing and examining the patient.  Written consent was obtained from the patient after discussion of risks, benefits, and alternatives. Patient has consented to proceed with Esophagogastroduodenoscopy and Colonoscopy with possible intervention   10/22/2023 12:17 PM  Wynona Canes  has presented today for surgery, with the diagnosis of 787.20 (ICD-9-CM) - R13.10 (ICD-10-CM) - Dysphagia, unspecified typeV18.51 (ICD-9-CM) - Z83.719 (ICD-10-CM) - Family history of colonic polyps.  The various methods of treatment have been discussed with the patient and family. After consideration of risks, benefits and other options for treatment, the patient has consented to  Procedure(s): COLONOSCOPY WITH PROPOFOL (N/A) ESOPHAGOGASTRODUODENOSCOPY (EGD) WITH PROPOFOL (N/A) as a surgical intervention.  The patient's history has been reviewed, patient examined, no change in status, stable for surgery.  I have reviewed the patient's chart and labs.  Questions were answered to the patient's satisfaction.     Jaynie Collins

## 2023-10-22 NOTE — Op Note (Signed)
[facility] Gastroenterology Patient Name: Shawna Santiago Procedure Date: 10/22/2023 12:14 PM MRN: [id number] Account #: [id number] Date of Birth: 1966/05/16 Admit Type: Outpatient Age: 58 Room: [facility] ENDO ROOM 1 Gender: Female Note Status: Finalized Instrument Name: Prentice Docker [id number] Procedure:             Colonoscopy Indications:           Colon cancer screening in patient at increased risk:                         Family history of 1st-degree relative with colon polyps Providers:             Trenda Moots, DO Referring MD:          Duane Lope. Judithann Sheen, MD (Referring MD) Medicines:             Monitored Anesthesia Care Complications:         No immediate complications. Estimated blood loss: None. Procedure:             Pre-Anesthesia Assessment:                        - Prior to the procedure, a History and Physical was                         performed, and patient medications and allergies were                         reviewed. The patient is competent. The risks and                         benefits of the procedure and the sedation options and                         risks were discussed with the patient. All questions                         were answered and informed consent was obtained.                         Patient identification and proposed procedure were                         verified by the physician, the nurse, the anesthetist                         and the technician in the endoscopy suite. Mental                         Status Examination: alert and oriented. Airway                         Examination: normal oropharyngeal airway and neck                         mobility. Respiratory Examination: clear to                         auscultation. CV Examination: RRR, no murmurs, no S3  or S4. Prophylactic Antibiotics: The patient does not                         require prophylactic antibiotics. Prior                          Anticoagulants: The patient has taken no anticoagulant                         or antiplatelet agents. ASA Grade Assessment: III - A                         patient with severe systemic disease. After reviewing                         the risks and benefits, the patient was deemed in                         satisfactory condition to undergo the procedure. The                         anesthesia plan was to use monitored anesthesia care                         (MAC). Immediately prior to administration of                         medications, the patient was re-assessed for adequacy                         to receive sedatives. The heart rate, respiratory                         rate, oxygen saturations, blood pressure, adequacy of                         pulmonary ventilation, and response to care were                         monitored throughout the procedure. The physical                         status of the patient was re-assessed after the                         procedure.                        After obtaining informed consent, the colonoscope was                         passed under direct vision. Throughout the procedure,                         the patient's blood pressure, pulse, and oxygen                         saturations were monitored continuously. The  Colonoscope was introduced through the anus and                         advanced to the the ascending colon. The colonoscopy                         was performed with moderate difficulty due to                         inadequate bowel prep, poor bowel prep with stool                         present, a redundant colon and the patient's body                         habitus. Successful completion of the procedure was                         aided by changing the patient to a supine position,                         changing the patient to a prone position,                         straightening and  shortening the scope to obtain bowel                         loop reduction, using scope torsion, applying                         abdominal pressure, lavage and receiving assistance                         from additional staff. The patient tolerated the                         procedure well. The quality of the bowel preparation                         was poor and inadequate. The rectum was photographed. Findings:      The perianal and digital rectal examinations were normal. Pertinent       negatives include normal sphincter tone.      The colon (entire examined portion) was moderately redundant. Estimated       blood loss: none.      Extensive amounts of semi-liquid semi-solid stool was found in the       entire colon, precluding visualization. Lavage of the area was performed       using copious amounts, resulting in incomplete clearance with continued       poor visualization. Estimated blood loss: none.      The lumen of the colon (entire examined portion) was moderately dilated.       Estimated blood loss: none. Impression:            - Preparation of the colon was poor.                        - Preparation of the colon was inadequate.                        -  Redundant colon.                        - Stool in the entire examined colon.                        - Dilated in the entire examined colon.                        - No specimens collected. Recommendation:        - Patient has a contact number available for                         emergencies. The signs and symptoms of potential                         delayed complications were discussed with the patient.                         Return to normal activities tomorrow. Written                         discharge instructions were provided to the patient.                        - Discharge patient to home.                        - Resume previous diet.                        - Continue present medications.                         - Start bowel regimen- Capful of miralax daily                        - Repeat colonoscopy in 6 months because the bowel                         preparation was poor.                        - Return to GI office as previously scheduled.                        - The findings and recommendations were discussed with                         the patient. Procedure Code(s):     --- Professional ---                        [phone], 53, Colonoscopy, flexible; diagnostic,                         including collection of specimen(s) by brushing or                         washing, when performed (separate procedure) Diagnosis Code(s):     --- Professional ---  Z83.71, Family history of colonic polyps                        K59.39, Other megacolon                        Q43.8, Other specified congenital malformations of                         intestine CPT copyright 2022 American Medical Association. All rights reserved. The codes documented in this report are preliminary and upon coder review may  be revised to meet current compliance requirements. Attending Participation:      I personally performed the entire procedure. Elfredia Nevins, DO Jaynie Collins DO, DO 10/22/2023 1:24:36 PM This report has been signed electronically. Number of Addenda: 0 Note Initiated On: 10/22/2023 12:14 PM Scope Withdrawal Time: 0 hours 0 minutes 1 second  Total Procedure Duration: 0 hours 29 minutes 23 seconds  Estimated Blood Loss:  Estimated blood loss: none.      [facility]

## 2023-10-22 NOTE — Op Note (Addendum)
[facility] Gastroenterology Patient Name: Shawna Santiago Procedure Date: 10/22/2023 12:14 PM MRN: [id number] Account #: [id number] Date of Birth: [birth year] Admit Type: Outpatient Age: 58 Room: [facility] ENDO ROOM 1 Gender: Female Note Status: Finalized Instrument Name: Patton Salles Endoscope [id number] Procedure:             Upper GI endoscopy Indications:           Dysphagia Providers:             Trenda Moots, DO Referring MD:          Duane Lope. Judithann Sheen, MD (Referring MD) Medicines:             Monitored Anesthesia Care Complications:         No immediate complications. Estimated blood loss:                         Minimal. Procedure:             Pre-Anesthesia Assessment:                        - Prior to the procedure, a History and Physical was                         performed, and patient medications and allergies were                         reviewed. The patient is competent. The risks and                         benefits of the procedure and the sedation options and                         risks were discussed with the patient. All questions                         were answered and informed consent was obtained.                         Patient identification and proposed procedure were                         verified by the physician, the nurse, the anesthetist                         and the technician in the endoscopy suite. Mental                         Status Examination: alert and oriented. Airway                         Examination: normal oropharyngeal airway and neck                         mobility. Respiratory Examination: clear to                         auscultation. CV Examination: RRR, no murmurs, no S3  or S4. Prophylactic Antibiotics: The patient does not                         require prophylactic antibiotics. Prior                         Anticoagulants: The patient has taken no anticoagulant                          or antiplatelet agents. ASA Grade Assessment: III - A                         patient with severe systemic disease. After reviewing                         the risks and benefits, the patient was deemed in                         satisfactory condition to undergo the procedure. The                         anesthesia plan was to use monitored anesthesia care                         (MAC). Immediately prior to administration of                         medications, the patient was re-assessed for adequacy                         to receive sedatives. The heart rate, respiratory                         rate, oxygen saturations, blood pressure, adequacy of                         pulmonary ventilation, and response to care were                         monitored throughout the procedure. The physical                         status of the patient was re-assessed after the                         procedure.                        After obtaining informed consent, the endoscope was                         passed under direct vision. Throughout the procedure,                         the patient's blood pressure, pulse, and oxygen                         saturations were monitored continuously. The Endoscope  was introduced through the mouth, and advanced to the                         second part of duodenum. The upper GI endoscopy was                         accomplished without difficulty. The patient tolerated                         the procedure well. Findings:      The duodenal bulb, first portion of the duodenum and second portion of       the duodenum were normal. Estimated blood loss: none.      Localized mild inflammation characterized by erythema was found in the       gastric antrum. Biopsies were taken with a cold forceps for Helicobacter       pylori testing. Estimated blood loss was minimal.      The exam of the stomach was otherwise normal.      The Z-line  was irregular. 2 cm tongue of salmon colored mucosa extending       above the z line. Biopsies were taken with a cold forceps for histology.       Estimated blood loss was minimal.      Esophagogastric landmarks were identified: the gastroesophageal junction       was found at 34 cm from the incisors.      Normal mucosa was found in the entire esophagus. The scope was       withdrawn. Dilation was performed with a Maloney dilator with no       resistance at 52 Fr. The dilation site was examined following endoscope       reinsertion and showed mild mucosal disruption. Estimated blood loss was       minimal. Biopsies were obtained from the proximal with cold forceps for       histology of suspected eosinophilic esophagitis. Estimated blood loss       was minimal.      The exam of the esophagus was otherwise normal. Impression:            - Normal duodenal bulb, first portion of the duodenum                         and second portion of the duodenum.                        - Gastritis. Biopsied.                        - Z-line irregular. Biopsied.                        - Esophagogastric landmarks identified.                        - Normal mucosa was found in the entire esophagus.                         Dilated.                        - Biopsies were taken with a cold forceps for  evaluation of eosinophilic esophagitis. Recommendation:        - Patient has a contact number available for                         emergencies. The signs and symptoms of potential                         delayed complications were discussed with the patient.                         Return to normal activities tomorrow. Written                         discharge instructions were provided to the patient.                        - Discharge patient to home.                        - Soft diet today.                        - Continue present medications.                        - No ibuprofen,  naproxen, or other non-steroidal                         anti-inflammatory drugs for 5 days.                        - Await pathology results.                        - Repeat upper endoscopy PRN for retreatment.                        - Return to GI office as previously scheduled.                        - The findings and recommendations were discussed with                         the patient.                        - proceed with colonoscopy. see report for further                         recommendations Procedure Code(s):     --- Professional ---                        [phone], Esophagogastroduodenoscopy, flexible,                         transoral; with biopsy, single or multiple                        [id number], Dilation of esophagus, by unguided sound or  bougie, single or multiple passes Diagnosis Code(s):     --- Professional ---                        K29.70, Gastritis, unspecified, without bleeding                        K22.89, Other specified disease of esophagus                        R13.10, Dysphagia, unspecified CPT copyright 2022 American Medical Association. All rights reserved. The codes documented in this report are preliminary and upon coder review may  be revised to meet current compliance requirements. Attending Participation:      I personally performed the entire procedure. Elfredia Nevins, DO Jaynie Collins DO, DO 10/22/2023 12:40:21 PM This report has been signed electronically. Number of Addenda: 0 Note Initiated On: 10/22/2023 12:14 PM Estimated Blood Loss:  Estimated blood loss was minimal.      [facility]

## 2023-10-22 NOTE — Anesthesia Procedure Notes (Signed)
Procedure Name: General with mask airway Date/Time: 10/22/2023 12:27 PM  Performed by: Mohammed Kindle, CRNAPre-anesthesia Checklist: Patient identified, Emergency Drugs available, Suction available and Patient being monitored Patient Re-evaluated:Patient Re-evaluated prior to induction Oxygen Delivery Method: Simple face mask Induction Type: IV induction Placement Confirmation: positive ETCO2 and breath sounds checked- equal and bilateral Dental Injury: Teeth and Oropharynx as per pre-operative assessment

## 2023-10-22 NOTE — Transfer of Care (Signed)
Immediate Anesthesia Transfer of Care Note  Patient: Shawna Santiago  Procedure(s) Performed: COLONOSCOPY WITH PROPOFOL ESOPHAGOGASTRODUODENOSCOPY (EGD) WITH PROPOFOL BIOPSY ESOPHAGEAL DILATION  Patient Location: Endoscopy Unit  Anesthesia Type:General  Level of Consciousness: awake, drowsy, and patient cooperative  Airway & Oxygen Therapy: Patient Spontanous Breathing and Patient connected to face mask oxygen  Post-op Assessment: Report given to RN and Post -op Vital signs reviewed and stable  Post vital signs: Reviewed and stable  Last Vitals:  Vitals Value Taken Time  BP 107/65 10/22/23 1316  Temp 36.2 C 10/22/23 1316  Pulse 97 10/22/23 1316  Resp 16 10/22/23 1316  SpO2 99 % 10/22/23 1316    Last Pain:  Vitals:   10/22/23 1316  TempSrc: Temporal  PainSc: Asleep         Complications: No notable events documented.

## 2023-10-22 NOTE — Anesthesia Preprocedure Evaluation (Signed)
Anesthesia Evaluation  Patient identified by MRN, date of birth, ID band Patient awake    Reviewed: Allergy & Precautions, NPO status , Patient's Chart, lab work & pertinent test results  Airway Mallampati: II  TM Distance: >3 FB Neck ROM: full    Dental  (+) Teeth Intact   Pulmonary neg pulmonary ROS, sleep apnea    Pulmonary exam normal breath sounds clear to auscultation       Cardiovascular Exercise Tolerance: Good hypertension, Pt. on medications negative cardio ROS Normal cardiovascular exam Rhythm:Regular Rate:Normal     Neuro/Psych  Headaches  Anxiety  Bipolar Disorder   negative neurological ROS  negative psych ROS   GI/Hepatic negative GI ROS, Neg liver ROS,GERD  Medicated,,  Endo/Other  negative endocrine ROS  Class 3 obesity  Renal/GU Renal diseasenegative Renal ROSS/p R nephrectomy  negative genitourinary   Musculoskeletal  (+) Arthritis ,    Abdominal  (+) + obese  Peds negative pediatric ROS (+)  Hematology negative hematology ROS (+)   Anesthesia Other Findings Past Medical History: No date: ADHD (attention deficit hyperactivity disorder) No date: Allergy No date: Anxiety No date: Arthritis 2001: Bipolar 1 disorder, mixed (HCC)     Comment:  11/10: Pt reports she is working with neuropsychiatrist/              psychotherapist for meds managment and regular therapy.  No date: Bronchitis 1999: Cancer (HCC)     Comment:  kidney cancer No date: Carpal tunnel syndrome of right wrist No date: Degenerative disc disease, lumbar No date: Depression No date: GERD (gastroesophageal reflux disease) No date: H/O unilateral nephrectomy No date: History of kidney stones No date: Hyperlipidemia No date: Hypertension No date: Irritable bowel syndrome with both constipation and diarrhea No date: Migraine No date: Nontraumatic complete tear of left rotator cuff No date: PTSD (post-traumatic stress  disorder) No date: Sleep apnea  Past Surgical History: 10/05/1998: ABDOMINAL HYSTERECTOMY     Comment:  R ovary remains (Laproscopic procedure)  10/05/1998: CHOLECYSTECTOMY 07/05/2015: COLONOSCOPY WITH PROPOFOL; N/A     Comment:  Procedure: COLONOSCOPY WITH PROPOFOL;  Surgeon: Scot Jun, MD;  Location: [facility] ENDOSCOPY;  Service:               Endoscopy;  Laterality: N/A; No date: DIAGNOSTIC LAPAROSCOPY 07/05/2015: ESOPHAGOGASTRODUODENOSCOPY     Comment:  Procedure: ESOPHAGOGASTRODUODENOSCOPY (EGD);  Surgeon:               Scot Jun, MD;  Location: [facility] ENDOSCOPY;                Service: Endoscopy;; 09/09/2015: LAPAROSCOPIC OOPHERECTOMY; Right     Comment:  Procedure: LAPAROSCOPIC OOPHERECTOMY, left peritinel               cyst wall removed;  Surgeon: Christeen Douglas, MD;                Location: [facility] ORS;  Service: Gynecology;  Laterality:               Right; No date: LITHOTRIPSY No date: NEUROPLASTY / TRANSPOSITION ULNAR NERVE AT ELBOW 10/05/1997: removal of R kidney      Comment:  due to cancer 08/29/2019: SHOULDER ARTHROSCOPY WITH SUBACROMIAL DECOMPRESSION AND  OPEN ROTATOR C; Left     Comment:  Procedure: SHOULDER ARTHROSCOPY WITH DEBRIDEMENT,               SUBACROMIAL  DECOMPRESSION AND OPEN ROTATOR CUFF REPAIR,               PROBABLEOPEN BICEP TENDON REPAIR;  Surgeon: Christena Flake, MD;  Location: [facility] ORS;  Service: Orthopedics;                Laterality: Left; 10/05/1996: TUBAL LIGATION No date: wisdom teeth removal  BMI    Body Mass Index: 36.50 kg/m      Reproductive/Obstetrics negative OB ROS                             Anesthesia Physical Anesthesia Plan  ASA: 3  Anesthesia Plan: General   Post-op Pain Management:    Induction: Intravenous  PONV Risk Score and Plan: Propofol infusion and TIVA  Airway Management Planned: Natural Airway and Nasal Cannula  Additional Equipment:    Intra-op Plan:   Post-operative Plan:   Informed Consent: I have reviewed the patients History and Physical, chart, labs and discussed the procedure including the risks, benefits and alternatives for the proposed anesthesia with the patient or authorized representative who has indicated his/her understanding and acceptance.     Dental Advisory Given  Plan Discussed with: CRNA  Anesthesia Plan Comments:        Anesthesia Quick Evaluation

## 2023-10-22 NOTE — H&P (Signed)
Pre-Procedure H&P   Patient ID: Shawna Santiago is a 58 y.o. female.  Gastroenterology Provider: Jaynie Collins, DO  Referring Provider: Tawni Pummel, PA PCP: Marguarite Arbour, MD  Date: 10/22/2023  HPI Ms. Shawna Santiago is a 58 y.o. female who presents today for Esophagogastroduodenoscopy and Colonoscopy for Dysphagia; family history of colon polyps .  Patient notes dysphagia to liquids solids and pills no odynophagia weight and appetite are stable.  Notes coughing and regurgitation but feels like reflux is well-controlled on her PPI.  Barium swallow was attempted in October 2024, however, due to the patient nausea she was unable to tolerate the barium itself and was not an adequate study.  Deals with chronic constipation.  Has not started recommended MiraLAX regimen yet.  No melena or hematochezia  EGD and colonoscopy September 2016 demonstrating internal hemorrhoids.  Random biopsies negative for microscopic colitis.  Salmon-colored mucosa in the esophagus was biopsied but negative for Barrett's esophagus.  Gastritis was negative for H. pylori on biopsy. duodenum was normal  Status post hysterectomy cholecystectomy and appendectomy Father with history of colon polyps Takes Voltaren twice a day.  No other NSAID use   Past Medical History:  Diagnosis Date   ADHD (attention deficit hyperactivity disorder)    Allergy    Anxiety    Arthritis    Bipolar 1 disorder, mixed (HCC) 2001   11/10: Pt reports she is working with neuropsychiatrist/ psychotherapist for meds managment and regular therapy.    Bronchitis    Cancer (HCC) 1999   kidney cancer   Carpal tunnel syndrome of right wrist    Degenerative disc disease, lumbar    Depression    GERD (gastroesophageal reflux disease)    H/O unilateral nephrectomy    History of kidney stones    Hyperlipidemia    Hypertension    Irritable bowel syndrome with both constipation and diarrhea    Migraine    Nontraumatic  complete tear of left rotator cuff    PTSD (post-traumatic stress disorder)    Sleep apnea     Past Surgical History:  Procedure Laterality Date   ABDOMINAL HYSTERECTOMY  10/05/1998   R ovary remains (Laproscopic procedure)    CHOLECYSTECTOMY  10/05/1998   COLONOSCOPY WITH PROPOFOL N/A 07/05/2015   Procedure: COLONOSCOPY WITH PROPOFOL;  Surgeon: Scot Jun, MD;  Location: [facility] ENDOSCOPY;  Service: Endoscopy;  Laterality: N/A;   DIAGNOSTIC LAPAROSCOPY     ESOPHAGOGASTRODUODENOSCOPY  07/05/2015   Procedure: ESOPHAGOGASTRODUODENOSCOPY (EGD);  Surgeon: Scot Jun, MD;  Location: [facility] END[facility]COPY;  Service: Endoscopy;;   LAPAROSCOPIC OOPHERECTOMY Right 09/09/2015   Procedure: LAPAROSCOPIC OOPHERECTOMY, left peritinel cyst wall removed;  Surgeon: Christeen Douglas, MD;  Location: [facility] ORS;  Service: Gynecology;  Laterality: Right;   LITHOTRIPSY     NEUROPLASTY / TRANSPOSITION ULNAR NERVE AT ELBOW     removal of R kidney   10/05/1997   due to cancer   SHOULDER ARTHROSCOPY WITH SUBACROMIAL DECOMPRESSION AND OPEN ROTATOR C Left 08/29/2019   Procedure: SHOULDER ARTHROSCOPY WITH DEBRIDEMENT, SUBACROMIAL DECOMPRESSION AND OPEN ROTATOR CUFF REPAIR, PROBABLEOPEN BICEP TENDON REPAIR;  Surgeon: Christena Flake, MD;  Location: [facility] ORS;  Service: Orthopedics;  Laterality: Left;   TUBAL LIGATION  10/05/1996   wisdom teeth removal      Family History Fhx- colon polyps- father No h/o GI disease or malignancy  Review of Systems  Constitutional:  Negative for activity change, appetite change, chills, diaphoresis, fatigue, fever and unexpected weight change.  HENT:  Positive for trouble swallowing. Negative for voice change.   Respiratory:  Positive for cough. Negative for shortness of breath and wheezing.   Cardiovascular:  Negative for chest pain, palpitations and leg swelling.  Gastrointestinal:  Positive for abdominal pain and constipation. Negative for abdominal distention, anal bleeding,  blood in stool, diarrhea, nausea, rectal pain and vomiting.  Musculoskeletal:  Negative for arthralgias and myalgias.  Skin:  Negative for color change and pallor.  Neurological:  Negative for dizziness, syncope and weakness.  Psychiatric/Behavioral:  Negative for confusion.   All other systems reviewed and are negative.    Medications No current facility-administered medications on file prior to encounter.   Current Outpatient Medications on File Prior to Encounter  Medication Sig Dispense Refill   amphetamine-dextroamphetamine (ADDERALL) 20 MG tablet Take 30 mg by mouth daily in the afternoon.      BuPROPion HBr (APLENZIN) 522 MG TB24 Take 522 mg by mouth every morning.     Cyanocobalamin (B-12) 5000 MCG CAPS Take 5,000 mcg by mouth every morning.     diclofenac (CATAFLAM) 50 MG tablet Take 50 mg by mouth 3 (three) times daily as needed (pain).     estradiol (ESTRACE) 2 MG tablet Take 2 mg by mouth every morning.     lisdexamfetamine (VYVANSE) 70 MG capsule Take 70 mg by mouth daily.      omeprazole (PRILOSEC) 40 MG capsule Take 40 mg by mouth every morning.      propranolol ER (INDERAL LA) 60 MG 24 hr capsule Take 60 mg by mouth every evening.      rosuvastatin (CRESTOR) 20 MG tablet Take 20 mg by mouth every morning.      ALPRAZolam (XANAX) 0.25 MG tablet Take 0.25-0.5 mg by mouth See admin instructions. Take 0.25 mg by mouth in the morning and 0.5 mg in the evening     amitriptyline (ELAVIL) 25 MG tablet Take 25 mg by mouth every evening.      Cariprazine HCl (VRAYLAR) 4.5 MG CAPS Take 4.5 mg by mouth every evening.     cetirizine (ZYRTEC) 10 MG tablet Take 10 mg by mouth every morning.      dicyclomine (BENTYL) 10 MG capsule Take 1 capsule (10 mg total) by mouth 4 (four) times daily -  before meals and at bedtime. (Patient taking differently: Take 10 mg by mouth 2 (two) times daily. ) 30 capsule 0   naltrexone (DEPADE) 50 MG tablet Take 50 mg by mouth every evening.  (Patient not  taking: Reported on 10/22/2023)     oxyCODONE (ROXICODONE) 5 MG immediate release tablet Take 1-2 tablets (5-10 mg total) by mouth every 4 (four) hours as needed for moderate pain or severe pain. (Patient not taking: Reported on 11/13/2019) 50 tablet 0   tiZANidine (ZANAFLEX) 2 MG tablet Take 2-4 mg by mouth See admin instructions. Take 2 mg by mouth in the afternoon and 4 mg in the evening     topiramate (TOPAMAX) 50 MG tablet Take 50 mg by mouth 3 (three) times daily.  (Patient not taking: Reported on 10/22/2023)     vortioxetine HBr (TRINTELLIX) 20 MG TABS tablet Take 20 mg by mouth daily. (Patient not taking: Reported on 10/22/2023)      Pertinent medications related to GI and procedure were reviewed by me with the patient prior to the procedure   Current Facility-Administered Medications:    0.9 %  sodium chloride infusion, , Intravenous, Continuous, Jaynie Collins, DO, Last Rate: 20  mL/hr at 10/22/23 1155, Continued from Pre-op at 10/22/23 1155  sodium chloride 20 mL/hr at 10/22/23 1155       Allergies  Allergen Reactions   Lyrica [Pregabalin] Other (See Comments)    halucinations   Stadol [Butorphanol] Hives   Allergies were reviewed by me prior to the procedure  Objective   Body mass index is 36.5 kg/m. Vitals:   10/22/23 1123  BP: 130/73  Pulse: 80  Resp: 20  Temp: (!) 97 F (36.1 C)  TempSrc: Temporal  SpO2: 97%  Weight: 87.6 kg  Height: 5\' 1"  (1.549 m)     Physical Exam Vitals and nursing note reviewed.  Constitutional:      General: She is not in acute distress.    Appearance: Normal appearance. She is obese. She is not ill-appearing, toxic-appearing or diaphoretic.  HENT:     Head: Normocephalic and atraumatic.     Nose: Nose normal.     Mouth/Throat:     Mouth: Mucous membranes are moist.     Pharynx: Oropharynx is clear.  Eyes:     General: No scleral icterus.    Extraocular Movements: Extraocular movements intact.  Cardiovascular:     Rate  and Rhythm: Normal rate and regular rhythm.     Heart sounds: Normal heart sounds. No murmur heard.    No friction rub. No gallop.  Pulmonary:     Effort: Pulmonary effort is normal. No respiratory distress.     Breath sounds: Normal breath sounds. No wheezing, rhonchi or rales.  Abdominal:     General: Bowel sounds are normal. There is no distension.     Palpations: Abdomen is soft.     Tenderness: There is no abdominal tenderness. There is no guarding or rebound.  Musculoskeletal:     Cervical back: Neck supple.     Right lower leg: No edema.     Left lower leg: No edema.  Skin:    General: Skin is warm and dry.     Coloration: Skin is not jaundiced or pale.  Neurological:     General: No focal deficit present.     Mental Status: She is alert and oriented to person, place, and time. Mental status is at baseline.  Psychiatric:        Mood and Affect: Mood normal.        Behavior: Behavior normal.        Thought Content: Thought content normal.        Judgment: Judgment normal.      Assessment:  Ms. Shawna Santiago is a 58 y.o. female  who presents today for Esophagogastroduodenoscopy and Colonoscopy for Dysphagia; family history of colon polyps.  Plan:  Esophagogastroduodenoscopy and Colonoscopy with possible intervention today  Esophagogastroduodenoscopy and Colonoscopy with possible biopsy, control of bleeding, polypectomy, and interventions as necessary has been discussed with the patient/patient representative. Informed consent was obtained from the patient/patient representative after explaining the indication, nature, and risks of the procedure including but not limited to death, bleeding, perforation, missed neoplasm/lesions, cardiorespiratory compromise, and reaction to medications. Opportunity for questions was given and appropriate answers were provided. Patient/patient representative has verbalized understanding is amenable to undergoing the procedure.   Jaynie Collins, DO  [facility]  Portions of the record may have been created with voice recognition software. Occasional wrong-word or 'sound-a-like' substitutions may have occurred due to the inherent limitations of voice recognition software.  Read the chart carefully and recognize, using context, where substitutions  may have occurred.

## 2023-10-22 NOTE — Anesthesia Postprocedure Evaluation (Signed)
Anesthesia Post Note  Patient: Shawna Santiago  Procedure(s) Performed: COLONOSCOPY WITH PROPOFOL ESOPHAGOGASTRODUODENOSCOPY (EGD) WITH PROPOFOL BIOPSY ESOPHAGEAL DILATION  Patient location during evaluation: PACU Anesthesia Type: General Level of consciousness: awake Pain management: satisfactory to patient Vital Signs Assessment: post-procedure vital signs reviewed and stable Respiratory status: spontaneous breathing Cardiovascular status: blood pressure returned to baseline Anesthetic complications: no   No notable events documented.   Last Vitals:  Vitals:   10/22/23 1123 10/22/23 1316  BP: 130/73 107/65  Pulse: 80 97  Resp: 20 16  Temp: (!) 36.1 C (!) 36.2 C  SpO2: 97% 99%    Last Pain:  Vitals:   10/22/23 1316  TempSrc: Temporal  PainSc: Asleep                 VAN STAVEREN,Ezra Marquess

## 2023-10-25 ENCOUNTER — Encounter: Payer: Self-pay | Admitting: Gastroenterology

## 2023-10-25 LAB — SURGICAL PATHOLOGY

## 2024-04-12 ENCOUNTER — Ambulatory Visit

## 2024-04-12 DIAGNOSIS — Z83719 Family history of colon polyps, unspecified: Secondary | ICD-10-CM | POA: Diagnosis not present

## 2024-04-12 DIAGNOSIS — Z1211 Encounter for screening for malignant neoplasm of colon: Secondary | ICD-10-CM | POA: Diagnosis present

## 2024-05-23 ENCOUNTER — Other Ambulatory Visit: Payer: Self-pay | Admitting: Internal Medicine

## 2024-05-23 DIAGNOSIS — Z1231 Encounter for screening mammogram for malignant neoplasm of breast: Secondary | ICD-10-CM

## 2024-09-25 ENCOUNTER — Ambulatory Visit
Admission: RE | Admit: 2024-09-25 | Discharge: 2024-09-25 | Disposition: A | Discharge status: Home / Self Care | Source: Ambulatory Visit | Attending: Internal Medicine | Admitting: Internal Medicine

## 2024-09-25 DIAGNOSIS — Z1231 Encounter for screening mammogram for malignant neoplasm of breast: Secondary | ICD-10-CM | POA: Insufficient documentation
